# Patient Record
Sex: Female | Born: 1935 | Race: White | Hispanic: No | Marital: Married | Smoking: Never smoker
Health system: Southern US, Community
[De-identification: ages and names within clinical notes are randomized; demographics above are authoritative.]

## PROBLEM LIST (undated history)

## (undated) DIAGNOSIS — E039 Hypothyroidism, unspecified: Secondary | ICD-10-CM

## (undated) DIAGNOSIS — D473 Essential (hemorrhagic) thrombocythemia: Secondary | ICD-10-CM

## (undated) DIAGNOSIS — I10 Essential (primary) hypertension: Secondary | ICD-10-CM

## (undated) DIAGNOSIS — C50919 Malignant neoplasm of unspecified site of unspecified female breast: Secondary | ICD-10-CM

## (undated) DIAGNOSIS — E785 Hyperlipidemia, unspecified: Secondary | ICD-10-CM

## (undated) HISTORY — PX: ABDOMINAL HYSTERECTOMY: SHX81

## (undated) HISTORY — DX: Malignant neoplasm of unspecified site of unspecified female breast: C50.919

## (undated) HISTORY — DX: Hypothyroidism, unspecified: E03.9

## (undated) HISTORY — DX: Essential (primary) hypertension: I10

## (undated) HISTORY — PX: MASTECTOMY: SHX3

## (undated) HISTORY — DX: Hyperlipidemia, unspecified: E78.5

---

## 2003-08-09 ENCOUNTER — Inpatient Hospital Stay (HOSPITAL_COMMUNITY): Admission: RE | Admit: 2003-08-09 | Discharge: 2003-08-10 | Payer: Self-pay | Admitting: Neurosurgery

## 2004-04-03 ENCOUNTER — Ambulatory Visit: Payer: Self-pay | Admitting: General Surgery

## 2005-04-25 ENCOUNTER — Ambulatory Visit: Payer: Self-pay | Admitting: General Surgery

## 2005-06-06 ENCOUNTER — Ambulatory Visit: Payer: Self-pay | Admitting: Unknown Physician Specialty

## 2006-05-06 ENCOUNTER — Ambulatory Visit: Payer: Self-pay | Admitting: General Surgery

## 2007-04-30 ENCOUNTER — Ambulatory Visit: Payer: Self-pay | Admitting: Internal Medicine

## 2008-05-03 ENCOUNTER — Ambulatory Visit: Payer: Self-pay | Admitting: General Surgery

## 2009-05-17 ENCOUNTER — Ambulatory Visit: Payer: Self-pay | Admitting: General Surgery

## 2012-01-28 ENCOUNTER — Ambulatory Visit: Payer: Self-pay | Admitting: Oncology

## 2012-02-12 ENCOUNTER — Ambulatory Visit: Payer: Self-pay | Admitting: Oncology

## 2012-02-12 LAB — CBC CANCER CENTER
Basophil #: 0.1 x10 3/mm (ref 0.0–0.1)
Basophil %: 0.7 %
HGB: 14.4 g/dL (ref 12.0–16.0)
Lymphocyte #: 2.9 x10 3/mm (ref 1.0–3.6)
MCH: 29.5 pg (ref 26.0–34.0)
MCHC: 33.4 g/dL (ref 32.0–36.0)
Monocyte #: 0.7 x10 3/mm (ref 0.2–0.9)
Neutrophil %: 52.7 %

## 2012-02-26 ENCOUNTER — Observation Stay: Payer: Self-pay | Admitting: Internal Medicine

## 2012-02-26 LAB — COMPREHENSIVE METABOLIC PANEL
BUN: 22 mg/dL — ABNORMAL HIGH (ref 7–18)
Bilirubin,Total: 0.4 mg/dL (ref 0.2–1.0)
Calcium, Total: 8.7 mg/dL (ref 8.5–10.1)
Co2: 25 mmol/L (ref 21–32)
EGFR (African American): >60
EGFR (Non-African Amer.): 59 — ABNORMAL LOW
Osmolality: 287 (ref 275–301)
SGPT (ALT): 15 U/L (ref 12–78)
Total Protein: 6.9 g/dL (ref 6.4–8.2)

## 2012-02-26 LAB — CBC
RBC: 4.83 10*6/uL (ref 3.80–5.20)
RDW: 14.3 % (ref 11.5–14.5)
WBC: 10.1 10*3/uL (ref 3.6–11.0)

## 2012-02-26 LAB — LIPASE, BLOOD: Lipase: 248 U/L (ref 73–393)

## 2012-02-26 LAB — CK TOTAL AND CKMB (NOT AT ARMC)
CK-MB: <0.5 ng/mL — ABNORMAL LOW (ref 0.5–3.6)
CK-MB: 0.7 ng/mL (ref 0.5–3.6)

## 2012-02-27 LAB — TROPONIN I: Troponin-I: <0.02 ng/mL

## 2012-02-27 LAB — CK TOTAL AND CKMB (NOT AT ARMC)
CK, Total: 38 U/L (ref 21–215)
CK-MB: <0.5 ng/mL — ABNORMAL LOW (ref 0.5–3.6)

## 2012-03-01 ENCOUNTER — Ambulatory Visit: Payer: Self-pay | Admitting: Oncology

## 2012-03-18 LAB — CBC CANCER CENTER
Eosinophil %: 3.2 %
HGB: 13.8 g/dL (ref 12.0–16.0)
MCH: 29.3 pg (ref 26.0–34.0)
Neutrophil %: 55.6 %
Platelet: 826 x10 3/mm — ABNORMAL HIGH (ref 150–440)
WBC: 11.5 x10 3/mm — ABNORMAL HIGH (ref 3.6–11.0)

## 2012-03-29 ENCOUNTER — Ambulatory Visit: Payer: Self-pay | Admitting: Oncology

## 2012-04-25 LAB — CBC CANCER CENTER
Basophil %: 0.4 %
HCT: 41.9 % (ref 35.0–47.0)
HGB: 14.1 g/dL (ref 12.0–16.0)
Lymphocyte #: 3.8 x10 3/mm — ABNORMAL HIGH (ref 1.0–3.6)
Lymphocyte %: 49.2 %
MCH: 30.8 pg (ref 26.0–34.0)
MCHC: 33.6 g/dL (ref 32.0–36.0)
Monocyte #: 0.5 x10 3/mm (ref 0.2–0.9)
RBC: 4.56 10*6/uL (ref 3.80–5.20)
RDW: 17.4 % — ABNORMAL HIGH (ref 11.5–14.5)
WBC: 7.8 x10 3/mm (ref 3.6–11.0)

## 2012-04-29 ENCOUNTER — Ambulatory Visit: Payer: Self-pay | Admitting: Oncology

## 2012-05-26 LAB — CBC CANCER CENTER
Basophil #: 0 x10 3/mm (ref 0.0–0.1)
Basophil %: 0.2 %
HGB: 13.4 g/dL (ref 12.0–16.0)
Lymphocyte #: 4 x10 3/mm — ABNORMAL HIGH (ref 1.0–3.6)
MCV: 95 fL (ref 80–100)
Monocyte #: 0.5 x10 3/mm (ref 0.2–0.9)
Monocyte %: 7.1 %
Platelet: 273 x10 3/mm (ref 150–440)
WBC: 6.9 x10 3/mm (ref 3.6–11.0)

## 2012-05-29 ENCOUNTER — Ambulatory Visit: Payer: Self-pay | Admitting: Oncology

## 2012-06-26 LAB — CBC CANCER CENTER
Basophil %: 0.4 %
HGB: 12.4 g/dL (ref 12.0–16.0)
Lymphocyte %: 53.7 %
MCV: 101 fL — ABNORMAL HIGH (ref 80–100)
Monocyte #: 0.5 x10 3/mm (ref 0.2–0.9)
Neutrophil #: 2.4 x10 3/mm (ref 1.4–6.5)
WBC: 6.4 x10 3/mm (ref 3.6–11.0)

## 2012-06-29 ENCOUNTER — Ambulatory Visit: Payer: Self-pay | Admitting: Oncology

## 2012-07-24 LAB — CBC CANCER CENTER
Basophil #: 0 x10 3/mm (ref 0.0–0.1)
Basophil %: 0.3 %
Eosinophil %: 1.4 %
HCT: 35.9 % (ref 35.0–47.0)
HGB: 12.6 g/dL (ref 12.0–16.0)
Lymphocyte #: 2.9 x10 3/mm (ref 1.0–3.6)
Lymphocyte %: 53.5 %
MCV: 106 fL — ABNORMAL HIGH (ref 80–100)
Monocyte #: 0.3 x10 3/mm (ref 0.2–0.9)
Neutrophil #: 2.1 x10 3/mm (ref 1.4–6.5)
Neutrophil %: 38.6 %
Platelet: 283 x10 3/mm (ref 150–440)
RBC: 3.4 10*6/uL — ABNORMAL LOW (ref 3.80–5.20)
WBC: 5.5 x10 3/mm (ref 3.6–11.0)

## 2012-07-29 ENCOUNTER — Ambulatory Visit: Payer: Self-pay | Admitting: Oncology

## 2012-09-23 ENCOUNTER — Ambulatory Visit: Payer: Self-pay | Admitting: Oncology

## 2012-09-23 LAB — CBC CANCER CENTER
Basophil #: 0 x10 3/mm (ref 0.0–0.1)
Basophil %: 0.4 %
Eosinophil #: 0.2 x10 3/mm (ref 0.0–0.7)
Eosinophil %: 2.8 %
HCT: 37.5 % (ref 35.0–47.0)
Lymphocyte #: 3.2 x10 3/mm (ref 1.0–3.6)
MCHC: 35.5 g/dL (ref 32.0–36.0)
MCV: 100 fL (ref 80–100)
Neutrophil %: 42.8 %
RBC: 3.75 10*6/uL — ABNORMAL LOW (ref 3.80–5.20)
RDW: 13.1 % (ref 11.5–14.5)

## 2012-09-29 ENCOUNTER — Ambulatory Visit: Payer: Self-pay | Admitting: Oncology

## 2012-12-30 ENCOUNTER — Ambulatory Visit: Payer: Self-pay | Admitting: Oncology

## 2012-12-30 LAB — CBC CANCER CENTER
Basophil #: 0 x10 3/mm (ref 0.0–0.1)
HCT: 43.1 % (ref 35.0–47.0)
HGB: 14.6 g/dL (ref 12.0–16.0)
Lymphocyte #: 3.6 x10 3/mm (ref 1.0–3.6)
Lymphocyte %: 39 %
MCV: 90 fL (ref 80–100)
Platelet: 731 x10 3/mm — ABNORMAL HIGH (ref 150–440)

## 2013-01-29 ENCOUNTER — Ambulatory Visit: Payer: Self-pay | Admitting: Oncology

## 2013-04-02 ENCOUNTER — Ambulatory Visit: Payer: Self-pay | Admitting: Oncology

## 2013-04-02 LAB — CBC CANCER CENTER
BASOS ABS: 0 x10 3/mm (ref 0.0–0.1)
BASOS PCT: 0.3 %
Eosinophil #: 0.1 x10 3/mm (ref 0.0–0.7)
Eosinophil %: 1.5 %
HCT: 38.6 % (ref 35.0–47.0)
HGB: 12.8 g/dL (ref 12.0–16.0)
LYMPHS PCT: 50.5 %
Lymphocyte #: 3.3 x10 3/mm (ref 1.0–3.6)
MCH: 32.8 pg (ref 26.0–34.0)
MCHC: 33.2 g/dL (ref 32.0–36.0)
MCV: 99 fL (ref 80–100)
MONOS PCT: 7.3 %
Monocyte #: 0.5 x10 3/mm (ref 0.2–0.9)
Neutrophil #: 2.6 x10 3/mm (ref 1.4–6.5)
Neutrophil %: 40.4 %
PLATELETS: 306 x10 3/mm (ref 150–440)
RBC: 3.91 10*6/uL (ref 3.80–5.20)
RDW: 22.4 % — ABNORMAL HIGH (ref 11.5–14.5)
WBC: 6.5 x10 3/mm (ref 3.6–11.0)

## 2013-04-29 ENCOUNTER — Ambulatory Visit: Payer: Self-pay | Admitting: Oncology

## 2013-07-03 ENCOUNTER — Ambulatory Visit: Payer: Self-pay | Admitting: Oncology

## 2013-07-03 LAB — CBC CANCER CENTER
BASOS ABS: 0 x10 3/mm (ref 0.0–0.1)
Basophil %: 0.3 %
EOS ABS: 0.1 x10 3/mm (ref 0.0–0.7)
Eosinophil %: 1.2 %
HCT: 38 % (ref 35.0–47.0)
HGB: 12.9 g/dL (ref 12.0–16.0)
Lymphocyte #: 3.7 x10 3/mm — ABNORMAL HIGH (ref 1.0–3.6)
Lymphocyte %: 54.6 %
MCH: 38.1 pg — AB (ref 26.0–34.0)
MCHC: 34 g/dL (ref 32.0–36.0)
MCV: 112 fL — ABNORMAL HIGH (ref 80–100)
MONO ABS: 0.5 x10 3/mm (ref 0.2–0.9)
MONOS PCT: 7 %
Neutrophil #: 2.5 x10 3/mm (ref 1.4–6.5)
Neutrophil %: 36.9 %
Platelet: 296 x10 3/mm (ref 150–440)
RBC: 3.39 10*6/uL — AB (ref 3.80–5.20)
RDW: 14.1 % (ref 11.5–14.5)
WBC: 6.8 x10 3/mm (ref 3.6–11.0)

## 2013-07-29 ENCOUNTER — Ambulatory Visit: Payer: Self-pay | Admitting: Oncology

## 2013-10-02 ENCOUNTER — Ambulatory Visit: Payer: Self-pay | Admitting: Oncology

## 2013-10-02 LAB — CBC CANCER CENTER
BASOS PCT: 0.3 %
Basophil #: 0 x10 3/mm (ref 0.0–0.1)
EOS ABS: 0.1 x10 3/mm (ref 0.0–0.7)
Eosinophil %: 1.6 %
HCT: 37 % (ref 35.0–47.0)
HGB: 12.5 g/dL (ref 12.0–16.0)
LYMPHS PCT: 55.8 %
Lymphocyte #: 3.3 x10 3/mm (ref 1.0–3.6)
MCH: 37.8 pg — AB (ref 26.0–34.0)
MCHC: 33.8 g/dL (ref 32.0–36.0)
MCV: 112 fL — AB (ref 80–100)
MONO ABS: 0.5 x10 3/mm (ref 0.2–0.9)
MONOS PCT: 9.1 %
NEUTROS ABS: 2 x10 3/mm (ref 1.4–6.5)
NEUTROS PCT: 33.2 %
Platelet: 328 x10 3/mm (ref 150–440)
RBC: 3.31 10*6/uL — AB (ref 3.80–5.20)
RDW: 14.3 % (ref 11.5–14.5)
WBC: 5.9 x10 3/mm (ref 3.6–11.0)

## 2013-10-29 ENCOUNTER — Ambulatory Visit: Payer: Self-pay | Admitting: Oncology

## 2014-01-01 ENCOUNTER — Ambulatory Visit: Payer: Self-pay | Admitting: Oncology

## 2014-01-01 LAB — CBC CANCER CENTER
Basophil #: 0 x10 3/mm (ref 0.0–0.1)
Basophil %: 0.3 %
EOS ABS: 0.1 x10 3/mm (ref 0.0–0.7)
EOS PCT: 1.4 %
HCT: 37.7 % (ref 35.0–47.0)
HGB: 12.7 g/dL (ref 12.0–16.0)
Lymphocyte #: 3.6 x10 3/mm (ref 1.0–3.6)
Lymphocyte %: 57.2 %
MCH: 36.9 pg — AB (ref 26.0–34.0)
MCHC: 33.7 g/dL (ref 32.0–36.0)
MCV: 110 fL — ABNORMAL HIGH (ref 80–100)
Monocyte #: 0.5 x10 3/mm (ref 0.2–0.9)
Monocyte %: 8.1 %
NEUTROS ABS: 2.1 x10 3/mm (ref 1.4–6.5)
NEUTROS PCT: 33 %
PLATELETS: 301 x10 3/mm (ref 150–440)
RBC: 3.44 10*6/uL — ABNORMAL LOW (ref 3.80–5.20)
RDW: 14 % (ref 11.5–14.5)
WBC: 6.2 x10 3/mm (ref 3.6–11.0)

## 2014-01-29 ENCOUNTER — Ambulatory Visit: Payer: Self-pay | Admitting: Oncology

## 2014-04-05 ENCOUNTER — Ambulatory Visit
Admit: 2014-04-05 | Disposition: A | Discharge status: Home / Self Care | Payer: Self-pay | Attending: Oncology | Admitting: Oncology

## 2014-04-30 ENCOUNTER — Ambulatory Visit
Admit: 2014-04-30 | Disposition: A | Discharge status: Home / Self Care | Payer: Self-pay | Attending: Oncology | Admitting: Oncology

## 2014-05-21 NOTE — H&P (Signed)
PATIENT NAME:  Rachel Durham, Rachel Durham MR#:  [id number] DATE OF BIRTH:  Apr 11, 1920  DATE OF ADMISSION:  02/26/2012  PRIMARY CARE PHYSICIAN: Lisette Grinder III, MD  CHIEF COMPLAINT: Left-sided chest pain.   HISTORY OF PRESENTING ILLNESS: This is an 81 year old female patient with history of hypertension, left breast cancer status post mastectomy and hypothyroidism who presented to the ER complaining of 4 hours of acute onset left-sided chest pain radiating to the left arm. This woke her up from sleep at 3:00 a.m. and lasted until 7:00 a.m. She had similar episode for short duration in the Emergency Room. She had associated nausea and shortness of breath with this and her symptoms have completely resolved at this time. The patient had a cath 10 years prior, which was normal, as per the daughter. Her symptoms started yesterday night with some burping. Normally no chest pain with exertion.   PAST MEDICAL HISTORY: 1. Hypertension. 2. Hypothyroidism. 3. Thrombocytosis. 4. Hyperlipidemia.   PAST SURGICAL HISTORY: 1. Breast cancer status post mastectomy, on the left side. 2. Total hysterectomy.   FAMILY HISTORY: Reviewed and unknown.   SOCIAL HISTORY: The patient does not smoke. No alcohol. No illicit drugs. Ambulates with a walker. No home oxygen.   CODE STATUS: FULL CODE.   HOME MEDICATIONS: 1. Aspirin 81 mg oral once a day.  2. Hydrochlorothiazide/lisinopril 12.5/20 mg once a day.  3. Nadolol 20 mg oral once a day.  4. Levothyroxine 75 mcg oral once a day.  5. Multivitamin 1 tablet oral once a day.  6. Acidophilus 2 tablets oral once a day.  7. Calcium with vitamin D 1 tablet once a day.  8. Garlic 1 capsule oral once a day.  9. Fish oil 1200 mg oral once a day. 10. Flaxseed oil 1 tablet oral once a day.  11. Vitamins D3 1000 international units oral once a day.  12. Glucosamine chondroitin 1 tablet oral 3 times a day.   REVIEW OF SYSTEMS:   CONSTITUTIONAL: No weight loss, weight gain or  fever.  EYES: No discharge, pain or redness.  ENT: No tinnitus, hearing loss or ringing.  GASTROINTESTINAL: No dysphagia, abdominal pain.  CARDIOVASCULAR: Complains of chest pain radiating to the left arm. No edema, PND or orthopnea.  RESPIRATORY: Has shortness of breath with chest pain, but no wheezing or cough.  GENITOURINARY: No dysuria or hematuria.   MUSCULOSKELETAL: Has some arthritis. No myalgias.  HEMATOLOGIC: Has thrombocytosis. No anemia, easy bruising or bleeding.  PSYCHIATRIC: No anxiety or depression.   ALLERGIES: EVISTA AND FOSAMAX.  PHYSICAL EXAMINATION:  VITALS: Temperature 98.8, pulse 74, blood pressure 149/77 and saturating 98% on room air.  GENERAL: Elderly Caucasian female patient lying in bed, comfortable, conversational, cooperative with exam.  PSYCHIATRIC: Alert and oriented x 3. Mood and affect appropriate. Judgment intact.  HEENT: Atraumatic, normocephalic. Oral mucosa moist and pink. External ears and nose normal. No pallor. No icterus. Pupils bilaterally are equally round and reactive to light.  NECK: Supple. No thyromegaly. No palpable lymph nodes. Trachea midline. No carotid bruit or JVD.  CARDIOVASCULAR: S1, S2. No chest wall tenderness. No edema. No murmurs. Peripheral pulses 2+.  RESPIRATORY: Normal work of breathing. Clear to auscultation on both sides. Normal to percussion.  ABDOMEN: Soft abdomen, nontender. Bowel sounds present. No hepatosplenomegaly palpable.  SKIN: Warm and dry. No petechiae, rash or ulcers.  MUSCULOSKELETAL: No joint swelling, redness or effusion of the large joints. Normal muscle tone.  NEUROLOGICAL: Motor strength 5 out of 5 in upper  and lower extremities. Sensation to fine touch intact all over. Cranial nerves II through XII intact.  LABS/RADIOLOGIC STUDIES: Troponin is less than 0.02. Albumin is 3.7 and creatinine 0.87. WBC 10.7 and platelets 723.   EKG shows Q waves and T wave inversions in V2 and V3. No prior EKGs to compare  with.   ASSESSMENT AND PLAN: 1. Chest pain in a patient with risk factors of hypertension and hyperlipidemia. The patient did have chest pain on the left side radiating to the left arm lasting four hours and now resolved. We will check 2 more sets of cardiac enzymes. Had normal cath, as per the daughter, 10 years prior. We will not put her through any stress test or cath at this time. She might need further testing if the enzymes are abnormal. The patient does seem to be very functional with her activities of daily living.  2. Hypertension, well controlled. Continue home medications.  3. Thrombocytosis, stable. Follows with Dr. Grayland Ormond.  4. Deep vein thrombosis prophylaxis with heparin.   CODE STATUS: FULL CODE.   TIME SPENT: 45 minutes. ____________________________ Leia Alf Aneka Fagerstrom, MD srs:sb D: 02/26/2012 13:27:42 ET T: 02/26/2012 13:53:02 ET JOB#: [id number]  cc: Alveta Heimlich R. Evalynn Hankins, MD, <Dictator> John B. Sarina Ser, MD Alveta Heimlich Arlice Colt MD ELECTRONICALLY SIGNED 03/06/2012 13:20

## 2014-07-07 ENCOUNTER — Encounter: Payer: Self-pay | Admitting: Oncology

## 2014-07-07 ENCOUNTER — Inpatient Hospital Stay: Payer: Medicare Other | Attending: Oncology

## 2014-07-07 ENCOUNTER — Other Ambulatory Visit: Payer: Self-pay | Admitting: *Deleted

## 2014-07-07 ENCOUNTER — Inpatient Hospital Stay (HOSPITAL_BASED_OUTPATIENT_CLINIC_OR_DEPARTMENT_OTHER): Payer: Medicare Other | Admitting: Oncology

## 2014-07-07 VITALS — BP 155/74 | HR 59 | Temp 95.8°F | Resp 16 | Wt 215.8 lb

## 2014-07-07 DIAGNOSIS — Z90710 Acquired absence of both cervix and uterus: Secondary | ICD-10-CM | POA: Diagnosis not present

## 2014-07-07 DIAGNOSIS — Z7982 Long term (current) use of aspirin: Secondary | ICD-10-CM | POA: Insufficient documentation

## 2014-07-07 DIAGNOSIS — Z79899 Other long term (current) drug therapy: Secondary | ICD-10-CM

## 2014-07-07 DIAGNOSIS — E039 Hypothyroidism, unspecified: Secondary | ICD-10-CM | POA: Diagnosis not present

## 2014-07-07 DIAGNOSIS — Z853 Personal history of malignant neoplasm of breast: Secondary | ICD-10-CM

## 2014-07-07 DIAGNOSIS — I10 Essential (primary) hypertension: Secondary | ICD-10-CM | POA: Insufficient documentation

## 2014-07-07 DIAGNOSIS — E785 Hyperlipidemia, unspecified: Secondary | ICD-10-CM | POA: Diagnosis not present

## 2014-07-07 DIAGNOSIS — Z9010 Acquired absence of unspecified breast and nipple: Secondary | ICD-10-CM

## 2014-07-07 DIAGNOSIS — D473 Essential (hemorrhagic) thrombocythemia: Secondary | ICD-10-CM | POA: Diagnosis not present

## 2014-07-07 DIAGNOSIS — D75839 Thrombocytosis, unspecified: Secondary | ICD-10-CM

## 2014-07-07 LAB — CBC WITH DIFFERENTIAL/PLATELET
BASOS PCT: 0 %
Basophils Absolute: 0 10*3/uL (ref 0–0.1)
EOS ABS: 0.1 10*3/uL (ref 0–0.7)
Eosinophils Relative: 2 %
HEMATOCRIT: 37.7 % (ref 35.0–47.0)
Hemoglobin: 12.7 g/dL (ref 12.0–16.0)
LYMPHS ABS: 3.5 10*3/uL (ref 1.0–3.6)
Lymphocytes Relative: 57 %
MCH: 36.6 pg — ABNORMAL HIGH (ref 26.0–34.0)
MCHC: 33.7 g/dL (ref 32.0–36.0)
MCV: 108.6 fL — AB (ref 80.0–100.0)
MONO ABS: 0.6 10*3/uL (ref 0.2–0.9)
MONOS PCT: 9 %
NEUTROS ABS: 1.9 10*3/uL (ref 1.4–6.5)
NEUTROS PCT: 32 %
PLATELETS: 334 10*3/uL (ref 150–440)
RBC: 3.47 MIL/uL — ABNORMAL LOW (ref 3.80–5.20)
RDW: 14.6 % — AB (ref 11.5–14.5)
WBC: 6.1 10*3/uL (ref 3.6–11.0)

## 2014-08-01 NOTE — Progress Notes (Signed)
[place]  Telephone:(336) [phone] [phone]) [phone]  ID: Rachel Durham OB: 20-Apr-1920  MR#: [id number]  JKD#:[id number]  Patient Care Team: Madelyn Brunner, MD as PCP - General (Internal Medicine)  CHIEF COMPLAINT:  Chief Complaint  Patient presents with  . Follow-up    thrombocytosis    INTERVAL HISTORY: Patient returns to clinic today for repeat laboratory work and further evaluation.  She continues to feel well and is asymptomatic. She is tolerating Hydrea without significant side effects.  She continues to have mild memory problems, but has no other neurologic complaints.  She has no recent fevers or illnesses.  She has a good appetite and denies weight loss.  She has no chest pain or shortness of breath.  She denies any nausea, vomiting, constipation, or diarrhea.  She has no urinary complaints.  Patient offers no specific complaints today.  REVIEW OF SYSTEMS:   Review of Systems  Constitutional: Negative.   Endo/Heme/Allergies: Does not bruise/bleed easily.    As per HPI. Otherwise, a complete review of systems is negatve.  PAST MEDICAL HISTORY: Past Medical History  Diagnosis Date  . Hypertension   . Hypothyroidism   . Breast cancer   . Hyperlipidemia     PAST SURGICAL HISTORY: Past Surgical History  Procedure Laterality Date  . Mastectomy    . Abdominal hysterectomy      FAMILY HISTORY No family history on file.     ADVANCED DIRECTIVES:    HEALTH MAINTENANCE: History  Substance Use Topics  . Smoking status: Never Smoker   . Smokeless tobacco: Never Used  . Alcohol Use: No     Colonoscopy:  PAP:  Bone density:  Lipid panel:  Allergies  Allergen Reactions  . Fosamax [Alendronate Sodium]   . Raloxifene     Other reaction(s): Unknown Other reaction(s): Unknown    Current Outpatient Prescriptions  Medication Sig Dispense Refill  . aspirin 81 MG tablet Take 81 mg by mouth daily.    . calcium carbonate (CALCIUM 600)  600 MG TABS tablet Take by mouth.    . Cholecalciferol (VITAMIN D3) 1000 UNITS CAPS Take 1 capsule by mouth daily.    . Flaxseed, Linseed, (FLAX SEED OIL PO) Take 1 capsule by mouth.    Marland Kitchen GARLIC OIL PO Take 1 capsule by mouth daily.    Marland Kitchen glucosamine-chondroitin 500-400 MG tablet Take 1 tablet by mouth 3 (three) times daily.    . hydroxyurea (HYDREA) 500 MG capsule     . Lactobacillus (ACIDOPHILUS) 100 MG CAPS Take 2 capsules by mouth daily.    Marland Kitchen levothyroxine (SYNTHROID, LEVOTHROID) 75 MCG tablet     . lisinopril-hydrochlorothiazide (PRINZIDE,ZESTORETIC) 20-12.5 MG per tablet     . Multiple Vitamin (MULTIVITAMIN) capsule Take 1 capsule by mouth daily.    . Omega-3 Fatty Acids (FISH OIL) 1200 MG CAPS Take 1 capsule by mouth daily.    . propranolol (INDERAL) 10 MG tablet      No current facility-administered medications for this visit.    OBJECTIVE: Filed Vitals:   07/07/14 1138  BP: 155/74  Pulse: 59  Temp: 95.8 F (35.4 C)  Resp: 16     There is no height on file to calculate BMI.    ECOG FS:1 - Symptomatic but completely ambulatory  General: Well-developed, well-nourished, no acute distress. Eyes: anicteric sclera. Lungs: Clear to auscultation bilaterally. Heart: Regular rate and rhythm. No rubs, murmurs, or gallops. Abdomen: Soft, nontender, nondistended. No organomegaly noted, normoactive bowel sounds. Musculoskeletal:  No edema, cyanosis, or clubbing. Neuro: Alert, answering all questions appropriately. Cranial nerves grossly intact. Skin: No rashes or petechiae noted. Psych: Normal affect.   LAB RESULTS:  Lab Results  Component Value Date   NA 142 02/26/2012   K 3.6 02/26/2012   CL 108* 02/26/2012   CO2 25 02/26/2012   GLUCOSE 101* 02/26/2012   BUN 22* 02/26/2012   CREATININE 0.87 02/26/2012   CALCIUM 8.7 02/26/2012   PROT 6.9 02/26/2012   ALBUMIN 3.7 02/26/2012   AST 23 02/26/2012   ALT 15 02/26/2012   ALKPHOS 122 02/26/2012   GFRNONAA 59* 02/26/2012    GFRAA >60 02/26/2012    Lab Results  Component Value Date   WBC 6.1 07/07/2014   NEUTROABS 1.9 07/07/2014   HGB 12.7 07/07/2014   HCT 37.7 07/07/2014   MCV 108.6* 07/07/2014   PLT 334 07/07/2014     STUDIES: No results found.  ASSESSMENT: Clinical essential thrombocytosis.  PLAN:    1.  ET: Patient's platelet count continues to be within normal limits. Continue 500 mg Hydrea daily. Despite the fact that her JAK-2 mutation is negative, she clinically has essential thrombocytosis. To confirm would require bone marrow biopsy, which is not necessary at this point. Previously patient was taken off Hydrea temporarily at which time her platelet count increased significantly.  Return to clinic in 3 months with repeat laboratory work and then in 6 months with repeat laboratory work and further evaluation.   2. Elevated MCV: Secondary to Hydrea.   Patient expressed understanding and was in agreement with this plan. She also understands that She can call clinic at any time with any questions, concerns, or complaints.    [name] Huger, MD   08/01/2014 10:30 AM

## 2014-08-09 ENCOUNTER — Telehealth: Payer: Self-pay | Admitting: *Deleted

## 2014-08-09 MED ORDER — HYDROXYUREA 500 MG PO CAPS: 500.0000 mg | ORAL_CAPSULE | Freq: Every day | ORAL | Status: DC

## 2014-08-09 NOTE — Telephone Encounter (Signed)
faxed

## 2014-10-07 ENCOUNTER — Inpatient Hospital Stay: Payer: Medicare Other | Attending: Oncology

## 2014-10-07 DIAGNOSIS — D473 Essential (hemorrhagic) thrombocythemia: Secondary | ICD-10-CM | POA: Diagnosis present

## 2014-10-07 LAB — CBC WITH DIFFERENTIAL/PLATELET
BASOS ABS: 0 10*3/uL (ref 0–0.1)
Basophils Relative: 0 %
EOS ABS: 0.1 10*3/uL (ref 0–0.7)
EOS PCT: 2 %
HCT: 36.7 % (ref 35.0–47.0)
HEMOGLOBIN: 12.6 g/dL (ref 12.0–16.0)
Lymphocytes Relative: 53 %
Lymphs Abs: 2.9 10*3/uL (ref 1.0–3.6)
MCH: 37.9 pg — ABNORMAL HIGH (ref 26.0–34.0)
MCHC: 34.3 g/dL (ref 32.0–36.0)
MCV: 110.5 fL — ABNORMAL HIGH (ref 80.0–100.0)
Monocytes Absolute: 0.5 10*3/uL (ref 0.2–0.9)
Monocytes Relative: 9 %
NEUTROS PCT: 36 %
Neutro Abs: 2 10*3/uL (ref 1.4–6.5)
PLATELETS: 341 10*3/uL (ref 150–440)
RBC: 3.32 MIL/uL — AB (ref 3.80–5.20)
RDW: 14.9 % — ABNORMAL HIGH (ref 11.5–14.5)
WBC: 5.4 10*3/uL (ref 3.6–11.0)

## 2014-12-08 ENCOUNTER — Telehealth: Payer: Self-pay | Admitting: *Deleted

## 2014-12-08 MED ORDER — HYDROXYUREA 500 MG PO CAPS: 500.0000 mg | ORAL_CAPSULE | Freq: Every day | ORAL | Status: DC

## 2014-12-08 NOTE — Telephone Encounter (Signed)
Escribed

## 2015-01-06 ENCOUNTER — Telehealth: Payer: Self-pay | Admitting: *Deleted

## 2015-01-06 ENCOUNTER — Inpatient Hospital Stay (HOSPITAL_BASED_OUTPATIENT_CLINIC_OR_DEPARTMENT_OTHER): Payer: Medicare Other | Admitting: Oncology

## 2015-01-06 ENCOUNTER — Inpatient Hospital Stay: Payer: Medicare Other | Attending: Oncology

## 2015-01-06 VITALS — BP 142/83 | HR 61 | Temp 97.4°F | Resp 16 | Wt 118.6 lb

## 2015-01-06 DIAGNOSIS — Z79899 Other long term (current) drug therapy: Secondary | ICD-10-CM | POA: Insufficient documentation

## 2015-01-06 DIAGNOSIS — D473 Essential (hemorrhagic) thrombocythemia: Secondary | ICD-10-CM | POA: Insufficient documentation

## 2015-01-06 DIAGNOSIS — I10 Essential (primary) hypertension: Secondary | ICD-10-CM

## 2015-01-06 DIAGNOSIS — Z7982 Long term (current) use of aspirin: Secondary | ICD-10-CM | POA: Insufficient documentation

## 2015-01-06 DIAGNOSIS — Z90710 Acquired absence of both cervix and uterus: Secondary | ICD-10-CM | POA: Insufficient documentation

## 2015-01-06 DIAGNOSIS — Z853 Personal history of malignant neoplasm of breast: Secondary | ICD-10-CM

## 2015-01-06 DIAGNOSIS — E039 Hypothyroidism, unspecified: Secondary | ICD-10-CM

## 2015-01-06 DIAGNOSIS — Z9010 Acquired absence of unspecified breast and nipple: Secondary | ICD-10-CM | POA: Insufficient documentation

## 2015-01-06 DIAGNOSIS — E785 Hyperlipidemia, unspecified: Secondary | ICD-10-CM | POA: Diagnosis not present

## 2015-01-06 DIAGNOSIS — R7989 Other specified abnormal findings of blood chemistry: Secondary | ICD-10-CM

## 2015-01-06 DIAGNOSIS — D75839 Thrombocytosis, unspecified: Secondary | ICD-10-CM

## 2015-01-06 LAB — CBC WITH DIFFERENTIAL/PLATELET
Basophils Absolute: 0 10*3/uL (ref 0–0.1)
Basophils Relative: 0 %
EOS ABS: 0.1 10*3/uL (ref 0–0.7)
EOS PCT: 2 %
HCT: 36.8 % (ref 35.0–47.0)
Hemoglobin: 12.6 g/dL (ref 12.0–16.0)
LYMPHS ABS: 2.5 10*3/uL (ref 1.0–3.6)
Lymphocytes Relative: 52 %
MCH: 37.4 pg — AB (ref 26.0–34.0)
MCHC: 34.2 g/dL (ref 32.0–36.0)
MCV: 109.1 fL — ABNORMAL HIGH (ref 80.0–100.0)
MONOS PCT: 11 %
Monocytes Absolute: 0.5 10*3/uL (ref 0.2–0.9)
Neutro Abs: 1.7 10*3/uL (ref 1.4–6.5)
Neutrophils Relative %: 35 %
PLATELETS: 323 10*3/uL (ref 150–440)
RBC: 3.37 MIL/uL — ABNORMAL LOW (ref 3.80–5.20)
RDW: 13.8 % (ref 11.5–14.5)
WBC: 4.9 10*3/uL (ref 3.6–11.0)

## 2015-01-06 MED ORDER — HYDROXYUREA 500 MG PO CAPS
500.0000 mg | ORAL_CAPSULE | Freq: Every day | ORAL | Status: DC
Start: 2015-01-06 — End: 2015-04-29

## 2015-01-06 NOTE — Telephone Encounter (Signed)
Patient has an appt this morning, Lorie states she will call her sister and make sure she comes in for it

## 2015-01-06 NOTE — Progress Notes (Signed)
Patient denies any problems today. 

## 2015-01-28 NOTE — Progress Notes (Signed)
[facility]  Telephone:(336) [phone] Fax:(336) [phone]  ID: Rachel Durham OB: 04-13-1920  MR#: [id number]  QMG#:[id number]  Patient Care Team: Madelyn Brunner, MD as PCP - General (Internal Medicine)  CHIEF COMPLAINT:  Chief Complaint  Patient presents with  . thrombocytosis    INTERVAL HISTORY: Patient returns to clinic today for repeat laboratory work and further evaluation.  She continues to feel well and is asymptomatic. She is tolerating Hydrea without significant side effects.  She has no neurologic complaints.  She has denies any recent fevers or illnesses.  She has a good appetite and denies weight loss.  She has no chest pain or shortness of breath.  She denies any nausea, vomiting, constipation, or diarrhea.  She has no urinary complaints.  Patient offers no specific complaints today.  REVIEW OF SYSTEMS:   Review of Systems  Constitutional: Negative.   Respiratory: Negative.   Cardiovascular: Negative.   Musculoskeletal: Negative.   Neurological: Negative.   Endo/Heme/Allergies: Does not bruise/bleed easily.  Psychiatric/Behavioral: Positive for memory loss.    As per HPI. Otherwise, a complete review of systems is negatve.  PAST MEDICAL HISTORY: Past Medical History  Diagnosis Date  . Hypertension   . Hypothyroidism   . Breast cancer   . Hyperlipidemia     PAST SURGICAL HISTORY: Past Surgical History  Procedure Laterality Date  . Mastectomy    . Abdominal hysterectomy      FAMILY HISTORY No family history on file.     ADVANCED DIRECTIVES:    HEALTH MAINTENANCE: Social History  Substance Use Topics  . Smoking status: Never Smoker   . Smokeless tobacco: Never Used  . Alcohol Use: No     Colonoscopy:  PAP:  Bone density:  Lipid panel:  Allergies  Allergen Reactions  . Fosamax [Alendronate Sodium]   . Raloxifene     Other reaction(s): Unknown Other reaction(s): Unknown    Current Outpatient Prescriptions    Medication Sig Dispense Refill  . aspirin 81 MG tablet Take 81 mg by mouth daily.    . calcium carbonate (CALCIUM 600) 600 MG TABS tablet Take by mouth.    . Cholecalciferol (VITAMIN D3) 1000 UNITS CAPS Take 1 capsule by mouth daily.    . Flaxseed, Linseed, (FLAX SEED OIL PO) Take 1 capsule by mouth.    Marland Kitchen GARLIC OIL PO Take 1 capsule by mouth daily.    Marland Kitchen glucosamine-chondroitin 500-400 MG tablet Take 1 tablet by mouth 3 (three) times daily.    . hydroxyurea (HYDREA) 500 MG capsule Take 1 capsule (500 mg total) by mouth daily. 30 capsule 3  . Lactobacillus (ACIDOPHILUS) 100 MG CAPS Take 2 capsules by mouth daily.    Marland Kitchen levothyroxine (SYNTHROID, LEVOTHROID) 75 MCG tablet     . lisinopril-hydrochlorothiazide (PRINZIDE,ZESTORETIC) 20-12.5 MG per tablet     . Multiple Vitamin (MULTIVITAMIN) capsule Take 1 capsule by mouth daily.    . Omega-3 Fatty Acids (FISH OIL) 1200 MG CAPS Take 1 capsule by mouth daily.    . propranolol (INDERAL) 10 MG tablet      No current facility-administered medications for this visit.    OBJECTIVE: Filed Vitals:   01/06/15 1040  BP: 142/83  Pulse: 61  Temp: 97.4 F (36.3 C)  Resp: 16     There is no height on file to calculate BMI.    ECOG FS:1 - Symptomatic but completely ambulatory  General: Well-developed, well-nourished, no acute distress. Eyes: anicteric sclera. Lungs: Clear to auscultation  bilaterally. Heart: Regular rate and rhythm. No rubs, murmurs, or gallops. Abdomen: Soft, nontender, nondistended. No organomegaly noted, normoactive bowel sounds. Musculoskeletal: No edema, cyanosis, or clubbing. Neuro: Alert, answering all questions appropriately. Cranial nerves grossly intact. Skin: No rashes or petechiae noted. Psych: Normal affect.   LAB RESULTS:  Lab Results  Component Value Date   NA 142 02/26/2012   K 3.6 02/26/2012   CL 108* 02/26/2012   CO2 25 02/26/2012   GLUCOSE 101* 02/26/2012   BUN 22* 02/26/2012   CREATININE 0.87  02/26/2012   CALCIUM 8.7 02/26/2012   PROT 6.9 02/26/2012   ALBUMIN 3.7 02/26/2012   AST 23 02/26/2012   ALT 15 02/26/2012   ALKPHOS 122 02/26/2012   BILITOT 0.4 02/26/2012   GFRNONAA 59* 02/26/2012   GFRAA >60 02/26/2012    Lab Results  Component Value Date   WBC 4.9 01/06/2015   NEUTROABS 1.7 01/06/2015   HGB 12.6 01/06/2015   HCT 36.8 01/06/2015   MCV 109.1* 01/06/2015   PLT 323 01/06/2015     STUDIES: No results found.  ASSESSMENT: Clinical essential thrombocytosis.  PLAN:    1.  ET: Patient's platelet count continues to be within normal limits. Continue 500 mg Hydrea and 81 mg aspirin daily. Despite the fact that her JAK-2 mutation is negative, she clinically has essential thrombocytosis. To confirm would require bone marrow biopsy, which is not necessary at this point. Previously patient was taken off Hydrea temporarily at which time her platelet count increased significantly.  Return to clinic in 6 months with repeat laboratory work and further evaluation.   2. Elevated MCV: Secondary to Hydrea.  3. Hypertension: Blood pressure mildly elevated today, continue current medications as prescribed.   Patient expressed understanding and was in agreement with this plan. She also understands that She can call clinic at any time with any questions, concerns, or complaints.    [name] Huger, MD   01/28/2015 9:02 AM

## 2015-04-29 ENCOUNTER — Other Ambulatory Visit: Payer: Self-pay | Admitting: *Deleted

## 2015-04-29 MED ORDER — HYDROXYUREA 500 MG PO CAPS: 500.0000 mg | ORAL_CAPSULE | Freq: Every day | ORAL | Status: DC

## 2015-07-07 ENCOUNTER — Inpatient Hospital Stay: Payer: Medicare Other | Attending: Oncology

## 2015-07-07 ENCOUNTER — Inpatient Hospital Stay (HOSPITAL_BASED_OUTPATIENT_CLINIC_OR_DEPARTMENT_OTHER): Payer: Medicare Other | Admitting: Oncology

## 2015-07-07 VITALS — BP 165/77 | HR 68 | Resp 16

## 2015-07-07 DIAGNOSIS — D75839 Thrombocytosis, unspecified: Secondary | ICD-10-CM

## 2015-07-07 DIAGNOSIS — I10 Essential (primary) hypertension: Secondary | ICD-10-CM

## 2015-07-07 DIAGNOSIS — Z9010 Acquired absence of unspecified breast and nipple: Secondary | ICD-10-CM | POA: Insufficient documentation

## 2015-07-07 DIAGNOSIS — Z853 Personal history of malignant neoplasm of breast: Secondary | ICD-10-CM | POA: Insufficient documentation

## 2015-07-07 DIAGNOSIS — E039 Hypothyroidism, unspecified: Secondary | ICD-10-CM | POA: Insufficient documentation

## 2015-07-07 DIAGNOSIS — Z7982 Long term (current) use of aspirin: Secondary | ICD-10-CM | POA: Diagnosis not present

## 2015-07-07 DIAGNOSIS — E785 Hyperlipidemia, unspecified: Secondary | ICD-10-CM

## 2015-07-07 DIAGNOSIS — Z79899 Other long term (current) drug therapy: Secondary | ICD-10-CM | POA: Insufficient documentation

## 2015-07-07 DIAGNOSIS — D473 Essential (hemorrhagic) thrombocythemia: Secondary | ICD-10-CM | POA: Insufficient documentation

## 2015-07-07 DIAGNOSIS — Z90710 Acquired absence of both cervix and uterus: Secondary | ICD-10-CM | POA: Diagnosis not present

## 2015-07-07 LAB — CBC WITH DIFFERENTIAL/PLATELET
BASOS ABS: 0 10*3/uL (ref 0–0.1)
BASOS PCT: 0 %
EOS ABS: 0.1 10*3/uL (ref 0–0.7)
Eosinophils Relative: 2 %
HEMATOCRIT: 36.8 % (ref 35.0–47.0)
Hemoglobin: 12.7 g/dL (ref 12.0–16.0)
Lymphocytes Relative: 53 %
Lymphs Abs: 2.7 10*3/uL (ref 1.0–3.6)
MCH: 37.4 pg — ABNORMAL HIGH (ref 26.0–34.0)
MCHC: 34.4 g/dL (ref 32.0–36.0)
MCV: 108.7 fL — ABNORMAL HIGH (ref 80.0–100.0)
MONO ABS: 0.5 10*3/uL (ref 0.2–0.9)
Monocytes Relative: 9 %
NEUTROS ABS: 1.9 10*3/uL (ref 1.4–6.5)
NEUTROS PCT: 36 %
Platelets: 322 10*3/uL (ref 150–440)
RBC: 3.39 MIL/uL — ABNORMAL LOW (ref 3.80–5.20)
RDW: 14.1 % (ref 11.5–14.5)
WBC: 5.2 10*3/uL (ref 3.6–11.0)

## 2015-07-07 MED ORDER — HYDROXYUREA 500 MG PO CAPS: 500.0000 mg | ORAL_CAPSULE | Freq: Every day | ORAL | Status: DC

## 2015-07-07 NOTE — Progress Notes (Signed)
[facility]  Telephone:(336) [phone] Fax:(336) [phone]  ID: Rachel Durham OB: 1920-04-01  MR#: [id number]  OKH#:[id number]  Patient Care Team: Madelyn Brunner, MD as PCP - General (Internal Medicine)  CHIEF COMPLAINT:  Chief Complaint  Patient presents with  . thrombocytosis    INTERVAL HISTORY: Patient returns to clinic today for repeat laboratory work and further evaluation.  She continues to feel well and is asymptomatic. She is tolerating Hydrea without significant side effects.  She has no neurologic complaints.  She has denies any recent fevers or illnesses.  She has a good appetite and denies weight loss.  She has no chest pain or shortness of breath.  She denies any nausea, vomiting, constipation, or diarrhea.  She has no urinary complaints.  Patient offers no specific complaints today.  REVIEW OF SYSTEMS:   Review of Systems  Constitutional: Negative.   Respiratory: Negative.   Cardiovascular: Negative.   Musculoskeletal: Negative.   Neurological: Negative.   Endo/Heme/Allergies: Does not bruise/bleed easily.  Psychiatric/Behavioral: Positive for memory loss.    As per HPI. Otherwise, a complete review of systems is negatve.  PAST MEDICAL HISTORY: Past Medical History  Diagnosis Date  . Hypertension   . Hypothyroidism   . Breast cancer   . Hyperlipidemia     PAST SURGICAL HISTORY: Past Surgical History  Procedure Laterality Date  . Mastectomy    . Abdominal hysterectomy      FAMILY HISTORY No family history on file.     ADVANCED DIRECTIVES:    HEALTH MAINTENANCE: Social History  Substance Use Topics  . Smoking status: Never Smoker   . Smokeless tobacco: Never Used  . Alcohol Use: No     Allergies  Allergen Reactions  . Fosamax [Alendronate Sodium]   . Raloxifene     Other reaction(s): Unknown Other reaction(s): Unknown    Current Outpatient Prescriptions  Medication Sig Dispense Refill  . aspirin 81 MG tablet  Take 81 mg by mouth daily.    . calcium carbonate (CALCIUM 600) 600 MG TABS tablet Take by mouth.    . Cholecalciferol (VITAMIN D3) 1000 UNITS CAPS Take 1 capsule by mouth daily.    . Flaxseed, Linseed, (FLAX SEED OIL PO) Take 1 capsule by mouth.    Marland Kitchen GARLIC OIL PO Take 1 capsule by mouth daily.    Marland Kitchen glucosamine-chondroitin 500-400 MG tablet Take 1 tablet by mouth 3 (three) times daily.    . hydroxyurea (HYDREA) 500 MG capsule Take 1 capsule (500 mg total) by mouth daily. 30 capsule 3  . Lactobacillus (ACIDOPHILUS) 100 MG CAPS Take 2 capsules by mouth daily.    Marland Kitchen levothyroxine (SYNTHROID, LEVOTHROID) 75 MCG tablet     . lisinopril-hydrochlorothiazide (PRINZIDE,ZESTORETIC) 20-12.5 MG per tablet     . Multiple Vitamin (MULTIVITAMIN) capsule Take 1 capsule by mouth daily.    . Omega-3 Fatty Acids (FISH OIL) 1200 MG CAPS Take 1 capsule by mouth daily.    . propranolol (INDERAL) 10 MG tablet      No current facility-administered medications for this visit.    OBJECTIVE: Filed Vitals:   07/07/15 1058  BP: 165/77  Pulse: 68  Resp: 16     There is no height or weight on file to calculate BMI.    ECOG FS:1 - Symptomatic but completely ambulatory  General: Well-developed, well-nourished, no acute distress. Eyes: anicteric sclera. Lungs: Clear to auscultation bilaterally. Heart: Regular rate and rhythm. No rubs, murmurs, or gallops. Abdomen: Soft, nontender, nondistended.  No organomegaly noted, normoactive bowel sounds. Musculoskeletal: No edema, cyanosis, or clubbing. Neuro: Alert, answering all questions appropriately. Cranial nerves grossly intact. Skin: No rashes or petechiae noted. Psych: Normal affect.   LAB RESULTS:  Lab Results  Component Value Date   NA 142 02/26/2012   K 3.6 02/26/2012   CL 108* 02/26/2012   CO2 25 02/26/2012   GLUCOSE 101* 02/26/2012   BUN 22* 02/26/2012   CREATININE 0.87 02/26/2012   CALCIUM 8.7 02/26/2012   PROT 6.9 02/26/2012   ALBUMIN 3.7  02/26/2012   AST 23 02/26/2012   ALT 15 02/26/2012   ALKPHOS 122 02/26/2012   BILITOT 0.4 02/26/2012   GFRNONAA 59* 02/26/2012   GFRAA >60 02/26/2012    Lab Results  Component Value Date   WBC 5.2 07/07/2015   NEUTROABS 1.9 07/07/2015   HGB 12.7 07/07/2015   HCT 36.8 07/07/2015   MCV 108.7* 07/07/2015   PLT 322 07/07/2015     STUDIES: No results found.  ASSESSMENT: Clinical essential thrombocytosis.  PLAN:    1.  ET: Patient's platelet count continues to be within normal limits at 322 today.. Continue 500 mg Hydrea and 81 mg aspirin daily. Despite the fact that her JAK-2 mutation is negative, she clinically has essential thrombocytosis. To confirm would require bone marrow biopsy, which is not necessary at this point. Previously patient was taken off Hydrea temporarily at which time her platelet count increased significantly.  Return to clinic in 6 months with repeat laboratory work and further evaluation.   2. Elevated MCV: Secondary to Hydrea.  3. Hypertension: Blood pressure mildly elevated today, continue current medications as prescribed.   Patient expressed understanding and was in agreement with this plan. She also understands that She can call clinic at any time with any questions, concerns, or complaints.   Dr. Grayland Ormond was available for consultation and review of plan of care for this patient.  Mayra Reel, NP   07/07/2015 11:04 AM

## 2015-07-07 NOTE — Progress Notes (Signed)
Patient does not offer any problems today.  

## 2016-01-04 DIAGNOSIS — D473 Essential (hemorrhagic) thrombocythemia: Secondary | ICD-10-CM | POA: Insufficient documentation

## 2016-01-04 NOTE — Progress Notes (Deleted)
[place]  Telephone:(336) [phone] Fax:(336) [phone]  ID: Rachel Durham OB: 11/23/1920  MR#: [id number]  KYH#:[id number]  Patient Care Team: Madelyn Brunner, MD as PCP - General (Internal Medicine)  CHIEF COMPLAINT: Clinical essential thrombocytosis.  INTERVAL HISTORY: Patient returns to clinic today for repeat laboratory work and further evaluation.  She continues to feel well and is asymptomatic. She is tolerating Hydrea without significant side effects.  She has no neurologic complaints.  She has denies any recent fevers or illnesses.  She has a good appetite and denies weight loss.  She has no chest pain or shortness of breath.  She denies any nausea, vomiting, constipation, or diarrhea.  She has no urinary complaints.  Patient offers no specific complaints today.  REVIEW OF SYSTEMS:   Review of Systems  Constitutional: Negative.   Respiratory: Negative.   Cardiovascular: Negative.   Musculoskeletal: Negative.   Neurological: Negative.   Endo/Heme/Allergies: Does not bruise/bleed easily.  Psychiatric/Behavioral: Positive for memory loss.    As per HPI. Otherwise, a complete review of systems is negatve.  PAST MEDICAL HISTORY: Past Medical History:  Diagnosis Date  . Breast cancer   . Hyperlipidemia   . Hypertension   . Hypothyroidism     PAST SURGICAL HISTORY: Past Surgical History:  Procedure Laterality Date  . ABDOMINAL HYSTERECTOMY    . MASTECTOMY      FAMILY HISTORY No family history on file.     ADVANCED DIRECTIVES:    HEALTH MAINTENANCE: Social History  Substance Use Topics  . Smoking status: Never Smoker  . Smokeless tobacco: Never Used  . Alcohol use No     Allergies  Allergen Reactions  . Fosamax [Alendronate Sodium]   . Raloxifene     Other reaction(s): Unknown Other reaction(s): Unknown    Current Outpatient Prescriptions  Medication Sig Dispense Refill  . aspirin 81 MG tablet Take 81 mg by mouth daily.    .  calcium carbonate (CALCIUM 600) 600 MG TABS tablet Take by mouth.    . Cholecalciferol (VITAMIN D3) 1000 UNITS CAPS Take 1 capsule by mouth daily.    . Flaxseed, Linseed, (FLAX SEED OIL PO) Take 1 capsule by mouth.    Marland Kitchen GARLIC OIL PO Take 1 capsule by mouth daily.    Marland Kitchen glucosamine-chondroitin 500-400 MG tablet Take 1 tablet by mouth 3 (three) times daily.    . hydroxyurea (HYDREA) 500 MG capsule Take 1 capsule (500 mg total) by mouth daily. 90 capsule 3  . Lactobacillus (ACIDOPHILUS) 100 MG CAPS Take 2 capsules by mouth daily.    Marland Kitchen levothyroxine (SYNTHROID, LEVOTHROID) 75 MCG tablet     . lisinopril-hydrochlorothiazide (PRINZIDE,ZESTORETIC) 20-12.5 MG per tablet     . Multiple Vitamin (MULTIVITAMIN) capsule Take 1 capsule by mouth daily.    . Omega-3 Fatty Acids (FISH OIL) 1200 MG CAPS Take 1 capsule by mouth daily.    . propranolol (INDERAL) 10 MG tablet      No current facility-administered medications for this visit.     OBJECTIVE: There were no vitals filed for this visit.   There is no height or weight on file to calculate BMI.    ECOG FS:1 - Symptomatic but completely ambulatory  General: Well-developed, well-nourished, no acute distress. Eyes: anicteric sclera. Lungs: Clear to auscultation bilaterally. Heart: Regular rate and rhythm. No rubs, murmurs, or gallops. Abdomen: Soft, nontender, nondistended. No organomegaly noted, normoactive bowel sounds. Musculoskeletal: No edema, cyanosis, or clubbing. Neuro: Alert, answering all questions appropriately.  Cranial nerves grossly intact. Skin: No rashes or petechiae noted. Psych: Normal affect.   LAB RESULTS:  Lab Results  Component Value Date   NA 142 02/26/2012   K 3.6 02/26/2012   CL 108 (H) 02/26/2012   CO2 25 02/26/2012   GLUCOSE 101 (H) 02/26/2012   BUN 22 (H) 02/26/2012   CREATININE 0.87 02/26/2012   CALCIUM 8.7 02/26/2012   PROT 6.9 02/26/2012   ALBUMIN 3.7 02/26/2012   AST 23 02/26/2012   ALT 15 02/26/2012    ALKPHOS 122 02/26/2012   BILITOT 0.4 02/26/2012   GFRNONAA 59 (L) 02/26/2012   GFRAA >60 02/26/2012    Lab Results  Component Value Date   WBC 5.2 07/07/2015   NEUTROABS 1.9 07/07/2015   HGB 12.7 07/07/2015   HCT 36.8 07/07/2015   MCV 108.7 (H) 07/07/2015   PLT 322 07/07/2015     STUDIES: No results found.  ASSESSMENT: Clinical essential thrombocytosis.  PLAN:    1.  ET: Patient's platelet count continues to be within normal limits at 322 today.. Continue 500 mg Hydrea and 81 mg aspirin daily. Despite the fact that her JAK-2 mutation is negative, she clinically has essential thrombocytosis. To confirm would require bone marrow biopsy, which is not necessary at this point. Previously patient was taken off Hydrea temporarily at which time her platelet count increased significantly.  Return to clinic in 6 months with repeat laboratory work and further evaluation.   2. Elevated MCV: Secondary to Hydrea.  3. Hypertension: Blood pressure mildly elevated today, continue current medications as prescribed.   Patient expressed understanding and was in agreement with this plan. She also understands that She can call clinic at any time with any questions, concerns, or complaints.   Dr. Grayland Ormond was available for consultation and review of plan of care for this patient.  [name] Huger, MD   01/04/2016 9:19 PM

## 2016-01-05 ENCOUNTER — Inpatient Hospital Stay: Payer: Medicare Other | Admitting: Oncology

## 2016-01-05 ENCOUNTER — Inpatient Hospital Stay: Payer: Medicare Other

## 2016-07-16 ENCOUNTER — Inpatient Hospital Stay: Payer: Medicare Other

## 2016-07-16 ENCOUNTER — Inpatient Hospital Stay: Payer: Medicare Other | Admitting: Oncology

## 2016-08-02 ENCOUNTER — Other Ambulatory Visit: Payer: Self-pay | Admitting: *Deleted

## 2016-08-02 NOTE — Telephone Encounter (Addendum)
Requesting a refill of her Hydrea, Has not been seen since 07/07/15 and no labs have been drawn by [place] or PCP since 08/04/15. She had cancelled her appt in December 2017 and did call to reschedule it until May 29/18; then it got rescheduled from 07/16/16 to 08/09/16. She will be out of medicine by then. Please advise   Component Name 08/04/15 01/27/15 07/28/14 07/24/13  Platelet Count  Specimen: 350  Specimen: 377  Specimen: 333  Specimen: 318  Immature Granulocyte %  Specimen: 0.2  Specimen:0

## 2016-08-02 NOTE — Telephone Encounter (Signed)
Per VO Dr Grayland Ormond, no refill until she has her labs checked, waiting 1 week should not cause a problem. I notified Lorie of this and she was fine with it and said she thought her appt was in 2 weeks and that she has enough to last until 7/12 when she sees him.

## 2016-08-07 NOTE — Progress Notes (Signed)
[place]  [phone]) [phone] Fax:(336) [phone]  ID: Rachel Durham OB: 1920/09/14  MR#: [id number]  XVQ#:[id number]  Patient Care Team: Madelyn Brunner, MD as PCP - General (Internal Medicine)  CHIEF COMPLAINT: Clinical essential thrombocytosis.  INTERVAL HISTORY: Patient returns to clinic today for repeat laboratory work and further evaluation. She was last evaluated on July 07, 2015. She continues to feel well and is asymptomatic. She states she is tolerating Hydrea without significant side effects.  She has no neurologic complaints.  She has denies any recent fevers or illnesses.  She has a good appetite and denies weight loss.  She has no chest pain or shortness of breath.  She denies any nausea, vomiting, constipation, or diarrhea.  She has no urinary complaints.  Patient offers no specific complaints today.  REVIEW OF SYSTEMS:   Review of Systems  Constitutional: Negative.  Negative for fever, malaise/fatigue and weight loss.  Respiratory: Negative.  Negative for cough and shortness of breath.   Cardiovascular: Negative.  Negative for chest pain and leg swelling.  Gastrointestinal: Negative.  Negative for abdominal pain.  Genitourinary: Negative.   Musculoskeletal: Negative.   Skin: Negative.  Negative for rash.  Neurological: Negative.  Negative for sensory change and weakness.  Endo/Heme/Allergies: Does not bruise/bleed easily.  Psychiatric/Behavioral: Positive for memory loss. The patient is not nervous/anxious.     As per HPI. Otherwise, a complete review of systems is negative.  PAST MEDICAL HISTORY: Past Medical History:  Diagnosis Date  . Breast cancer   . Hyperlipidemia   . Hypertension   . Hypothyroidism     PAST SURGICAL HISTORY: Past Surgical History:  Procedure Laterality Date  . ABDOMINAL HYSTERECTOMY    . MASTECTOMY      FAMILY HISTORY: Reviewed and unchanged. No reported history of malignancy or chronic  disease.     ADVANCED DIRECTIVES:    HEALTH MAINTENANCE: Social History  Substance Use Topics  . Smoking status: Never Smoker  . Smokeless tobacco: Never Used  . Alcohol use No     Colonoscopy:  PAP:  Bone density:  Lipid panel:  Allergies  Allergen Reactions  . Fosamax [Alendronate Sodium]   . Raloxifene     Other reaction(s): Unknown Other reaction(s): Unknown    Current Outpatient Prescriptions  Medication Sig Dispense Refill  . aspirin 81 MG tablet Take 81 mg by mouth daily.    . calcium carbonate (CALCIUM 600) 600 MG TABS tablet Take by mouth.    . Cholecalciferol (VITAMIN D3) 1000 UNITS CAPS Take 1 capsule by mouth daily.    . Flaxseed, Linseed, (FLAX SEED OIL PO) Take 1 capsule by mouth.    Marland Kitchen GARLIC OIL PO Take 1 capsule by mouth daily.    Marland Kitchen glucosamine-chondroitin 500-400 MG tablet Take 1 tablet by mouth 3 (three) times daily.    . hydroxyurea (HYDREA) 500 MG capsule Take 1 capsule (500 mg total) by mouth daily. 90 capsule 3  . Lactobacillus (ACIDOPHILUS) 100 MG CAPS Take 2 capsules by mouth daily.    Marland Kitchen levothyroxine (SYNTHROID, LEVOTHROID) 75 MCG tablet     . lisinopril-hydrochlorothiazide (PRINZIDE,ZESTORETIC) 20-12.5 MG per tablet     . Multiple Vitamin (MULTIVITAMIN) capsule Take 1 capsule by mouth daily.    . Omega-3 Fatty Acids (FISH OIL) 1200 MG CAPS Take 1 capsule by mouth daily.    . propranolol (INDERAL) 10 MG tablet      No current facility-administered medications for this visit.     OBJECTIVE:  Vitals:   08/09/16 1517  BP: (!) 147/76  Pulse: 60  Resp: 20  Temp: (!) 97.4 F (36.3 C)     There is no height or weight on file to calculate BMI.    ECOG FS:1 - Symptomatic but completely ambulatory  General: Well-developed, well-nourished, no acute distress. Eyes: anicteric sclera. Lungs: Clear to auscultation bilaterally. Heart: Regular rate and rhythm. No rubs, murmurs, or gallops. Abdomen: Soft, nontender, nondistended. No organomegaly  noted, normoactive bowel sounds. Musculoskeletal: No edema, cyanosis, or clubbing. Neuro: Alert, answering all questions appropriately. Cranial nerves grossly intact. Skin: No rashes or petechiae noted. Psych: Normal affect.   LAB RESULTS:  Lab Results  Component Value Date   NA 142 02/26/2012   K 3.6 02/26/2012   CL 108 (H) 02/26/2012   CO2 25 02/26/2012   GLUCOSE 101 (H) 02/26/2012   BUN 22 (H) 02/26/2012   CREATININE 0.87 02/26/2012   CALCIUM 8.7 02/26/2012   PROT 6.9 02/26/2012   ALBUMIN 3.7 02/26/2012   AST 23 02/26/2012   ALT 15 02/26/2012   ALKPHOS 122 02/26/2012   BILITOT 0.4 02/26/2012   GFRNONAA 59 (L) 02/26/2012   GFRAA >60 02/26/2012    Lab Results  Component Value Date   WBC 6.4 08/09/2016   NEUTROABS 2.7 08/09/2016   HGB 12.8 08/09/2016   HCT 36.8 08/09/2016   MCV 105.9 (H) 08/09/2016   PLT 497 (H) 08/09/2016     STUDIES: No results found.  ASSESSMENT: Clinical essential thrombocytosis.  PLAN:    1. ET: Patient's platelet count has trended up slightly. It is possible patient has missed doses of Hydrea contributing to this. No intervention or changes are needed at this time. Continue 500 mg Hydrea and 81 mg aspirin daily. Despite the fact that her JAK-2 mutation is negative, she clinically has essential thrombocytosis. To confirm would require bone marrow biopsy, which is not necessary at this point. Previously patient was taken off Hydrea temporarily at which time her platelet count increased significantly.  Return to clinic in 6 months with repeat laboratory work and further evaluation.   2. Elevated MCV: Secondary to Hydrea.  3. Hypertension: Blood pressure mildly elevated today, continue current medications as prescribed.   Patient expressed understanding and was in agreement with this plan. She also understands that She can call clinic at any time with any questions, concerns, or complaints.    [name] Huger, MD   08/12/2016 7:45  AM

## 2016-08-09 ENCOUNTER — Inpatient Hospital Stay: Payer: Medicare Other | Attending: Oncology

## 2016-08-09 ENCOUNTER — Inpatient Hospital Stay (HOSPITAL_BASED_OUTPATIENT_CLINIC_OR_DEPARTMENT_OTHER): Payer: Medicare Other | Admitting: Oncology

## 2016-08-09 VITALS — BP 147/76 | HR 60 | Temp 97.4°F | Resp 20 | Wt 122.6 lb

## 2016-08-09 DIAGNOSIS — Z9011 Acquired absence of right breast and nipple: Secondary | ICD-10-CM

## 2016-08-09 DIAGNOSIS — E785 Hyperlipidemia, unspecified: Secondary | ICD-10-CM | POA: Insufficient documentation

## 2016-08-09 DIAGNOSIS — D473 Essential (hemorrhagic) thrombocythemia: Secondary | ICD-10-CM

## 2016-08-09 DIAGNOSIS — Z7982 Long term (current) use of aspirin: Secondary | ICD-10-CM

## 2016-08-09 DIAGNOSIS — Z79899 Other long term (current) drug therapy: Secondary | ICD-10-CM

## 2016-08-09 DIAGNOSIS — Z853 Personal history of malignant neoplasm of breast: Secondary | ICD-10-CM | POA: Insufficient documentation

## 2016-08-09 DIAGNOSIS — R7989 Other specified abnormal findings of blood chemistry: Secondary | ICD-10-CM | POA: Diagnosis not present

## 2016-08-09 DIAGNOSIS — E039 Hypothyroidism, unspecified: Secondary | ICD-10-CM | POA: Insufficient documentation

## 2016-08-09 DIAGNOSIS — I10 Essential (primary) hypertension: Secondary | ICD-10-CM | POA: Diagnosis not present

## 2016-08-09 LAB — CBC WITH DIFFERENTIAL/PLATELET
BASOS ABS: 0 10*3/uL (ref 0–0.1)
Basophils Relative: 0 %
EOS ABS: 0.1 10*3/uL (ref 0–0.7)
EOS PCT: 2 %
HCT: 36.8 % (ref 35.0–47.0)
Hemoglobin: 12.8 g/dL (ref 12.0–16.0)
LYMPHS PCT: 48 %
Lymphs Abs: 3 10*3/uL (ref 1.0–3.6)
MCH: 36.8 pg — ABNORMAL HIGH (ref 26.0–34.0)
MCHC: 34.8 g/dL (ref 32.0–36.0)
MCV: 105.9 fL — AB (ref 80.0–100.0)
Monocytes Absolute: 0.5 10*3/uL (ref 0.2–0.9)
Monocytes Relative: 8 %
NEUTROS PCT: 42 %
Neutro Abs: 2.7 10*3/uL (ref 1.4–6.5)
PLATELETS: 497 10*3/uL — AB (ref 150–440)
RBC: 3.48 MIL/uL — AB (ref 3.80–5.20)
RDW: 14.7 % — ABNORMAL HIGH (ref 11.5–14.5)
WBC: 6.4 10*3/uL (ref 3.6–11.0)

## 2016-08-09 MED ORDER — HYDROXYUREA 500 MG PO CAPS: 500.0000 mg | ORAL_CAPSULE | Freq: Every day | ORAL | 3 refills | Status: DC

## 2016-08-09 NOTE — Progress Notes (Signed)
Patient denies any concerns today.  

## 2017-02-10 NOTE — Progress Notes (Signed)
[place]  Telephone:(336) [phone] Fax:(336) [phone]  ID: Rachel Durham OB: [birth year]  MR#: [id number]  SEL#:[id number]  Patient Care Team: Madelyn Brunner, MD as PCP - General (Internal Medicine)  CHIEF COMPLAINT: Clinical essential thrombocytosis.  INTERVAL HISTORY: Patient returns to clinic today for repeat laboratory work and further evaluation.  She continues to have increased memory loss and much of the history is given by her husband and daughter. She continues to feel well and is asymptomatic. She is tolerating Hydrea without significant side effects.  She has no neurologic complaints.  She has denies any recent fevers or illnesses.  She has a good appetite and denies weight loss.  She has no chest pain or shortness of breath.  She denies any nausea, vomiting, constipation, or diarrhea.  She has no urinary complaints.  Patient offers no specific complaints today.  REVIEW OF SYSTEMS:   Review of Systems  Constitutional: Negative.  Negative for fever, malaise/fatigue and weight loss.  Respiratory: Negative.  Negative for cough and shortness of breath.   Cardiovascular: Negative.  Negative for chest pain and leg swelling.  Gastrointestinal: Negative.  Negative for abdominal pain.  Genitourinary: Negative.   Musculoskeletal: Negative.   Skin: Negative.  Negative for rash.  Neurological: Negative.  Negative for sensory change and weakness.  Endo/Heme/Allergies: Does not bruise/bleed easily.  Psychiatric/Behavioral: Positive for memory loss. The patient is not nervous/anxious.     As per HPI. Otherwise, a complete review of systems is negative.  PAST MEDICAL HISTORY: Past Medical History:  Diagnosis Date  . Breast cancer   . Hyperlipidemia   . Hypertension   . Hypothyroidism     PAST SURGICAL HISTORY: Left mastectomy.  FAMILY HISTORY: Reviewed and unchanged. No reported history of malignancy or chronic disease.     ADVANCED DIRECTIVES:     HEALTH MAINTENANCE: Social History   Tobacco Use  . Smoking status: Never Smoker  . Smokeless tobacco: Never Used  Substance Use Topics  . Alcohol use: No  . Drug use: No     Colonoscopy:  PAP:  Bone density:  Lipid panel:  Allergies  Allergen Reactions  . Fosamax [Alendronate Sodium]   . Raloxifene     Other reaction(s): Unknown Other reaction(s): Unknown    Current Outpatient Medications  Medication Sig Dispense Refill  . aspirin 81 MG tablet Take 81 mg by mouth daily.    . calcium carbonate (CALCIUM 600) 600 MG TABS tablet Take by mouth.    . Cholecalciferol (VITAMIN D3) 1000 UNITS CAPS Take 1 capsule by mouth daily.    . Flaxseed, Linseed, (FLAX SEED OIL PO) Take 1 capsule by mouth.    Marland Kitchen GARLIC OIL PO Take 1 capsule by mouth daily.    Marland Kitchen glucosamine-chondroitin 500-400 MG tablet Take 1 tablet by mouth 3 (three) times daily.    . hydroxyurea (HYDREA) 500 MG capsule Take 1 capsule (500 mg total) by mouth daily. 90 capsule 3  . Lactobacillus (ACIDOPHILUS) 100 MG CAPS Take 2 capsules by mouth daily.    Marland Kitchen levothyroxine (SYNTHROID, LEVOTHROID) 75 MCG tablet     . lisinopril-hydrochlorothiazide (PRINZIDE,ZESTORETIC) 20-12.5 MG per tablet     . Multiple Vitamin (MULTIVITAMIN) capsule Take 1 capsule by mouth daily.    . Omega-3 Fatty Acids (FISH OIL) 1200 MG CAPS Take 1 capsule by mouth daily.    . propranolol (INDERAL) 10 MG tablet      No current facility-administered medications for this visit.  OBJECTIVE: Vitals:   02/12/17 1422  BP: 140/82  Pulse: 87  Resp: 20  Temp: (!) 96.8 F (36 C)     There is no height or weight on file to calculate BMI.    ECOG FS:1 - Symptomatic but completely ambulatory  General: Well-developed, well-nourished, no acute distress. Eyes: anicteric sclera. Lungs: Clear to auscultation bilaterally. Heart: Regular rate and rhythm. No rubs, murmurs, or gallops. Abdomen: Soft, nontender, nondistended. No organomegaly noted,  normoactive bowel sounds. Musculoskeletal: No edema, cyanosis, or clubbing. Neuro: Alert, answering all questions appropriately. Cranial nerves grossly intact. Skin: No rashes or petechiae noted. Psych: Normal affect.   LAB RESULTS:  Lab Results  Component Value Date   NA 142 02/26/2012   K 3.6 02/26/2012   CL 108 (H) 02/26/2012   CO2 25 02/26/2012   GLUCOSE 101 (H) 02/26/2012   BUN 22 (H) 02/26/2012   CREATININE 0.87 02/26/2012   CALCIUM 8.7 02/26/2012   PROT 6.9 02/26/2012   ALBUMIN 3.7 02/26/2012   AST 23 02/26/2012   ALT 15 02/26/2012   ALKPHOS 122 02/26/2012   BILITOT 0.4 02/26/2012   GFRNONAA 59 (L) 02/26/2012   GFRAA >60 02/26/2012    Lab Results  Component Value Date   WBC 6.3 02/12/2017   NEUTROABS 3.5 02/12/2017   HGB 13.0 02/12/2017   HCT 38.6 02/12/2017   MCV 109.7 (H) 02/12/2017   PLT 545 (H) 02/12/2017     STUDIES: No results found.  ASSESSMENT: Clinical essential thrombocytosis.  PLAN:    1. ET: Patient's platelet count remains mildly elevated, but essentially stable. No intervention or changes are needed at this time. Continue 500 mg Hydrea and 81 mg aspirin daily. Despite the fact that her JAK-2 mutation is negative, she clinically has essential thrombocytosis. To confirm would require bone marrow biopsy, which is not necessary at this point. Previously patient was taken off Hydrea temporarily at which time her platelet count increased significantly greater than 1000.  Return to clinic in 6 months with repeat laboratory work and further evaluation.   2. Elevated MCV: Secondary to Hydrea.  3. Hypertension: Blood pressure mildly elevated today, continue current medications as prescribed.   Patient expressed understanding and was in agreement with this plan. She also understands that She can call clinic at any time with any questions, concerns, or complaints.    [name] Huger, MD   02/16/2017 7:04 AM

## 2017-02-12 ENCOUNTER — Inpatient Hospital Stay (HOSPITAL_BASED_OUTPATIENT_CLINIC_OR_DEPARTMENT_OTHER): Payer: Medicare Other | Admitting: Oncology

## 2017-02-12 ENCOUNTER — Inpatient Hospital Stay: Payer: Medicare Other | Attending: Oncology

## 2017-02-12 ENCOUNTER — Other Ambulatory Visit: Payer: Self-pay

## 2017-02-12 VITALS — BP 140/82 | HR 87 | Temp 96.8°F | Resp 20 | Wt 122.0 lb

## 2017-02-12 DIAGNOSIS — E039 Hypothyroidism, unspecified: Secondary | ICD-10-CM

## 2017-02-12 DIAGNOSIS — Z7982 Long term (current) use of aspirin: Secondary | ICD-10-CM

## 2017-02-12 DIAGNOSIS — Z79899 Other long term (current) drug therapy: Secondary | ICD-10-CM

## 2017-02-12 DIAGNOSIS — D473 Essential (hemorrhagic) thrombocythemia: Secondary | ICD-10-CM | POA: Diagnosis present

## 2017-02-12 DIAGNOSIS — R413 Other amnesia: Secondary | ICD-10-CM | POA: Diagnosis not present

## 2017-02-12 DIAGNOSIS — I10 Essential (primary) hypertension: Secondary | ICD-10-CM | POA: Insufficient documentation

## 2017-02-12 DIAGNOSIS — R7989 Other specified abnormal findings of blood chemistry: Secondary | ICD-10-CM

## 2017-02-12 DIAGNOSIS — Z9012 Acquired absence of left breast and nipple: Secondary | ICD-10-CM | POA: Diagnosis not present

## 2017-02-12 DIAGNOSIS — E785 Hyperlipidemia, unspecified: Secondary | ICD-10-CM | POA: Insufficient documentation

## 2017-02-12 LAB — CBC WITH DIFFERENTIAL/PLATELET
BASOS PCT: 0 %
Basophils Absolute: 0 10*3/uL (ref 0–0.1)
EOS ABS: 0 10*3/uL (ref 0–0.7)
Eosinophils Relative: 1 %
HCT: 38.6 % (ref 35.0–47.0)
Hemoglobin: 13 g/dL (ref 12.0–16.0)
Lymphocytes Relative: 35 %
Lymphs Abs: 2.2 10*3/uL (ref 1.0–3.6)
MCH: 37.1 pg — ABNORMAL HIGH (ref 26.0–34.0)
MCHC: 33.8 g/dL (ref 32.0–36.0)
MCV: 109.7 fL — ABNORMAL HIGH (ref 80.0–100.0)
MONO ABS: 0.5 10*3/uL (ref 0.2–0.9)
MONOS PCT: 8 %
Neutro Abs: 3.5 10*3/uL (ref 1.4–6.5)
Neutrophils Relative %: 56 %
PLATELETS: 545 10*3/uL — AB (ref 150–440)
RBC: 3.52 MIL/uL — ABNORMAL LOW (ref 3.80–5.20)
RDW: 14.6 % — AB (ref 11.5–14.5)
WBC: 6.3 10*3/uL (ref 3.6–11.0)

## 2017-02-12 NOTE — Progress Notes (Signed)
Patient denies any concerns today.  

## 2017-07-17 ENCOUNTER — Other Ambulatory Visit: Payer: Self-pay | Admitting: Oncology

## 2017-07-17 DIAGNOSIS — D473 Essential (hemorrhagic) thrombocythemia: Secondary | ICD-10-CM

## 2017-08-27 ENCOUNTER — Other Ambulatory Visit: Payer: Medicare Other

## 2017-08-27 ENCOUNTER — Ambulatory Visit: Payer: Medicare Other | Admitting: Oncology

## 2017-09-02 NOTE — Progress Notes (Signed)
[place]  Telephone:(336) [phone] Fax:(336) [phone]  ID: Rachel Durham OB: [birth year]  MR#: [id number]  WER#:[id number]  Patient Care Team: Madelyn Brunner, MD as PCP - General (Internal Medicine)  CHIEF COMPLAINT: Clinical essential thrombocytosis.  INTERVAL HISTORY: Patient returns to clinic today for routine six-month follow-up.  She continues to have difficulty with her memory and much of the history is given by her daughter.  She currently feels well and is asymptomatic.  She continues to tolerate Hydrea without significant side effects.  She has no neurologic complaints.  She has denies any recent fevers or illnesses.  She has a good appetite and denies weight loss.  She has no chest pain or shortness of breath.  She denies any nausea, vomiting, constipation, or diarrhea.  She has no urinary complaints.  Patient offers no specific complaints today.  REVIEW OF SYSTEMS:   Review of Systems  Constitutional: Negative.  Negative for fever, malaise/fatigue and weight loss.  Respiratory: Negative.  Negative for cough and shortness of breath.   Cardiovascular: Negative.  Negative for chest pain and leg swelling.  Gastrointestinal: Negative.  Negative for abdominal pain.  Genitourinary: Negative.  Negative for dysuria.  Musculoskeletal: Negative.  Negative for myalgias.  Skin: Negative.  Negative for rash.  Neurological: Negative.  Negative for sensory change, focal weakness, weakness and headaches.  Endo/Heme/Allergies: Does not bruise/bleed easily.  Psychiatric/Behavioral: Positive for memory loss. The patient is not nervous/anxious.     As per HPI. Otherwise, a complete review of systems is negative.  PAST MEDICAL HISTORY: Past Medical History:  Diagnosis Date  . Breast cancer ([place])   . Hyperlipidemia   . Hypertension   . Hypothyroidism     PAST SURGICAL HISTORY: Left mastectomy.  FAMILY HISTORY: Reviewed and unchanged. No reported history of  malignancy or chronic disease.     ADVANCED DIRECTIVES:    HEALTH MAINTENANCE: Social History   Tobacco Use  . Smoking status: Never Smoker  . Smokeless tobacco: Never Used  Substance Use Topics  . Alcohol use: No  . Drug use: No     Colonoscopy:  PAP:  Bone density:  Lipid panel:  Allergies  Allergen Reactions  . Fosamax [Alendronate Sodium]   . Raloxifene     Other reaction(s): Unknown Other reaction(s): Unknown    Current Outpatient Medications  Medication Sig Dispense Refill  . aspirin 81 MG tablet Take 81 mg by mouth daily.    . calcium carbonate (CALCIUM 600) 600 MG TABS tablet Take by mouth.    . Cholecalciferol (VITAMIN D3) 1000 UNITS CAPS Take 1 capsule by mouth daily.    . Flaxseed, Linseed, (FLAX SEED OIL PO) Take 1 capsule by mouth.    Marland Kitchen GARLIC OIL PO Take 1 capsule by mouth daily.    Marland Kitchen glucosamine-chondroitin 500-400 MG tablet Take 1 tablet by mouth 3 (three) times daily.    . hydroxyurea (HYDREA) 500 MG capsule TAKE 1 CAPSULE (500 MG TOTAL) BY MOUTH DAILY. 90 capsule 3  . Lactobacillus (ACIDOPHILUS) 100 MG CAPS Take 2 capsules by mouth daily.    Marland Kitchen levothyroxine (SYNTHROID, LEVOTHROID) 75 MCG tablet     . lisinopril-hydrochlorothiazide (PRINZIDE,ZESTORETIC) 20-12.5 MG per tablet     . Multiple Vitamin (MULTIVITAMIN) capsule Take 1 capsule by mouth daily.    . Omega-3 Fatty Acids (FISH OIL) 1200 MG CAPS Take 1 capsule by mouth daily.    . propranolol (INDERAL) 10 MG tablet      No current facility-administered  medications for this visit.     OBJECTIVE: Vitals:   09/05/17 1505  BP: 115/65  Pulse: 84  Resp: 18  Temp: (!) 97.3 F (36.3 C)     There is no height or weight on file to calculate BMI.    ECOG FS:1 - Symptomatic but completely ambulatory  General: Thin, no acute distress. Eyes: Pink conjunctiva, anicteric sclera. HEENT: Normocephalic, moist mucous membranes. Lungs: Clear to auscultation bilaterally. Heart: Regular rate and rhythm. No  rubs, murmurs, or gallops. Abdomen: Soft, nontender, nondistended. No organomegaly noted, normoactive bowel sounds. Musculoskeletal: No edema, cyanosis, or clubbing. Neuro: Alert, answering all questions appropriately. Cranial nerves grossly intact. Skin: No rashes or petechiae noted. Psych: Normal affect.   LAB RESULTS:  Lab Results  Component Value Date   NA 142 02/26/2012   K 3.6 02/26/2012   CL 108 (H) 02/26/2012   CO2 25 02/26/2012   GLUCOSE 101 (H) 02/26/2012   BUN 22 (H) 02/26/2012   CREATININE 0.87 02/26/2012   CALCIUM 8.7 02/26/2012   PROT 6.9 02/26/2012   ALBUMIN 3.7 02/26/2012   AST 23 02/26/2012   ALT 15 02/26/2012   ALKPHOS 122 02/26/2012   BILITOT 0.4 02/26/2012   GFRNONAA 59 (L) 02/26/2012   GFRAA >60 02/26/2012    Lab Results  Component Value Date   WBC 6.7 09/05/2017   NEUTROABS 3.0 09/05/2017   HGB 12.5 09/05/2017   HCT 36.3 09/05/2017   MCV 110.8 (H) 09/05/2017   PLT 531 (H) 09/05/2017     STUDIES: No results found.  ASSESSMENT: Clinical essential thrombocytosis.  PLAN:    1. ET: Patient's platelet count remains mildly elevated at 531, but essentially unchanged from previous. No intervention or changes are needed at this time. Continue 500 mg Hydrea and 81 mg aspirin daily. Despite the fact that her JAK-2 mutation is negative, she clinically has essential thrombocytosis. To confirm would require bone marrow biopsy, which is not necessary at this point. Previously patient was taken off Hydrea temporarily at which time her platelet count increased significantly greater than 1000.  Return to clinic in 6 months with repeat laboratory work and further evaluation.   2. Elevated MCV: Chronic and unchanged.  Secondary to Hydrea.  3. Hypertension: Blood pressure is within normal limits today.  Continue monitoring and treatment per primary care.  I spent a total of 20 minutes face-to-face with the patient of which greater than 50% of the visit was spent in  counseling and coordination of care as detailed above.  Patient expressed understanding and was in agreement with this plan. She also understands that She can call clinic at any time with any questions, concerns, or complaints.    [name] Huger, MD   09/08/2017 9:19 AM

## 2017-09-05 ENCOUNTER — Inpatient Hospital Stay (HOSPITAL_BASED_OUTPATIENT_CLINIC_OR_DEPARTMENT_OTHER): Payer: Medicare Other | Admitting: Oncology

## 2017-09-05 ENCOUNTER — Inpatient Hospital Stay: Payer: Medicare Other | Attending: Oncology

## 2017-09-05 ENCOUNTER — Encounter: Payer: Self-pay | Admitting: Oncology

## 2017-09-05 VITALS — BP 115/65 | HR 84 | Temp 97.3°F | Resp 18 | Wt 122.0 lb

## 2017-09-05 DIAGNOSIS — Z7982 Long term (current) use of aspirin: Secondary | ICD-10-CM | POA: Insufficient documentation

## 2017-09-05 DIAGNOSIS — D473 Essential (hemorrhagic) thrombocythemia: Secondary | ICD-10-CM | POA: Insufficient documentation

## 2017-09-05 DIAGNOSIS — R7989 Other specified abnormal findings of blood chemistry: Secondary | ICD-10-CM | POA: Insufficient documentation

## 2017-09-05 DIAGNOSIS — Z79899 Other long term (current) drug therapy: Secondary | ICD-10-CM | POA: Diagnosis not present

## 2017-09-05 DIAGNOSIS — I10 Essential (primary) hypertension: Secondary | ICD-10-CM

## 2017-09-05 LAB — CBC WITH DIFFERENTIAL/PLATELET
BASOS PCT: 0 %
Basophils Absolute: 0 10*3/uL (ref 0–0.1)
EOS ABS: 0.1 10*3/uL (ref 0–0.7)
EOS PCT: 2 %
HCT: 36.3 % (ref 35.0–47.0)
Hemoglobin: 12.5 g/dL (ref 12.0–16.0)
LYMPHS ABS: 3.1 10*3/uL (ref 1.0–3.6)
Lymphocytes Relative: 46 %
MCH: 38.3 pg — AB (ref 26.0–34.0)
MCHC: 34.6 g/dL (ref 32.0–36.0)
MCV: 110.8 fL — ABNORMAL HIGH (ref 80.0–100.0)
MONOS PCT: 7 %
Monocytes Absolute: 0.4 10*3/uL (ref 0.2–0.9)
NEUTROS PCT: 45 %
Neutro Abs: 3 10*3/uL (ref 1.4–6.5)
PLATELETS: 531 10*3/uL — AB (ref 150–440)
RBC: 3.27 MIL/uL — ABNORMAL LOW (ref 3.80–5.20)
RDW: 14.5 % (ref 11.5–14.5)
WBC: 6.7 10*3/uL (ref 3.6–11.0)

## 2017-09-05 NOTE — Progress Notes (Signed)
Pt in for follow up, denies any difficulties or concerns.  

## 2018-02-28 NOTE — Progress Notes (Deleted)
[place]  Telephone:(336) [phone] Fax:(336) [phone]  ID: Rachel Durham OB: 08/21/1920  MR#: [id number]  ERD#:[id number]  Patient Care Team: Madelyn Brunner, MD as PCP - General (Internal Medicine)  CHIEF COMPLAINT: Clinical essential thrombocytosis.  INTERVAL HISTORY: Patient returns to clinic today for routine six-month follow-up.  She continues to have difficulty with her memory and much of the history is given by her daughter.  She currently feels well and is asymptomatic.  She continues to tolerate Hydrea without significant side effects.  She has no neurologic complaints.  She has denies any recent fevers or illnesses.  She has a good appetite and denies weight loss.  She has no chest pain or shortness of breath.  She denies any nausea, vomiting, constipation, or diarrhea.  She has no urinary complaints.  Patient offers no specific complaints today.  REVIEW OF SYSTEMS:   Review of Systems  Constitutional: Negative.  Negative for fever, malaise/fatigue and weight loss.  Respiratory: Negative.  Negative for cough and shortness of breath.   Cardiovascular: Negative.  Negative for chest pain and leg swelling.  Gastrointestinal: Negative.  Negative for abdominal pain.  Genitourinary: Negative.  Negative for dysuria.  Musculoskeletal: Negative.  Negative for myalgias.  Skin: Negative.  Negative for rash.  Neurological: Negative.  Negative for sensory change, focal weakness, weakness and headaches.  Endo/Heme/Allergies: Does not bruise/bleed easily.  Psychiatric/Behavioral: Positive for memory loss. The patient is not nervous/anxious.     As per HPI. Otherwise, a complete review of systems is negative.  PAST MEDICAL HISTORY: Past Medical History:  Diagnosis Date  . Breast cancer ([place])   . Hyperlipidemia   . Hypertension   . Hypothyroidism     PAST SURGICAL HISTORY: Left mastectomy.  FAMILY HISTORY: Reviewed and unchanged. No reported history of  malignancy or chronic disease.     ADVANCED DIRECTIVES:    HEALTH MAINTENANCE: Social History   Tobacco Use  . Smoking status: Never Smoker  . Smokeless tobacco: Never Used  Substance Use Topics  . Alcohol use: No  . Drug use: No     Colonoscopy:  PAP:  Bone density:  Lipid panel:  Allergies  Allergen Reactions  . Fosamax [Alendronate Sodium]   . Raloxifene     Other reaction(s): Unknown Other reaction(s): Unknown    Current Outpatient Medications  Medication Sig Dispense Refill  . aspirin 81 MG tablet Take 81 mg by mouth daily.    . calcium carbonate (CALCIUM 600) 600 MG TABS tablet Take by mouth.    . Cholecalciferol (VITAMIN D3) 1000 UNITS CAPS Take 1 capsule by mouth daily.    . Flaxseed, Linseed, (FLAX SEED OIL PO) Take 1 capsule by mouth.    Marland Kitchen GARLIC OIL PO Take 1 capsule by mouth daily.    Marland Kitchen glucosamine-chondroitin 500-400 MG tablet Take 1 tablet by mouth 3 (three) times daily.    . hydroxyurea (HYDREA) 500 MG capsule TAKE 1 CAPSULE (500 MG TOTAL) BY MOUTH DAILY. 90 capsule 3  . Lactobacillus (ACIDOPHILUS) 100 MG CAPS Take 2 capsules by mouth daily.    Marland Kitchen levothyroxine (SYNTHROID, LEVOTHROID) 75 MCG tablet     . lisinopril-hydrochlorothiazide (PRINZIDE,ZESTORETIC) 20-12.5 MG per tablet     . Multiple Vitamin (MULTIVITAMIN) capsule Take 1 capsule by mouth daily.    . Omega-3 Fatty Acids (FISH OIL) 1200 MG CAPS Take 1 capsule by mouth daily.    . propranolol (INDERAL) 10 MG tablet      No current facility-administered  medications for this visit.     OBJECTIVE: There were no vitals filed for this visit.   There is no height or weight on file to calculate BMI.    ECOG FS:1 - Symptomatic but completely ambulatory  General: Thin, no acute distress. Eyes: Pink conjunctiva, anicteric sclera. HEENT: Normocephalic, moist mucous membranes. Lungs: Clear to auscultation bilaterally. Heart: Regular rate and rhythm. No rubs, murmurs, or gallops. Abdomen: Soft,  nontender, nondistended. No organomegaly noted, normoactive bowel sounds. Musculoskeletal: No edema, cyanosis, or clubbing. Neuro: Alert, answering all questions appropriately. Cranial nerves grossly intact. Skin: No rashes or petechiae noted. Psych: Normal affect.   LAB RESULTS:  Lab Results  Component Value Date   NA 142 02/26/2012   K 3.6 02/26/2012   CL 108 (H) 02/26/2012   CO2 25 02/26/2012   GLUCOSE 101 (H) 02/26/2012   BUN 22 (H) 02/26/2012   CREATININE 0.87 02/26/2012   CALCIUM 8.7 02/26/2012   PROT 6.9 02/26/2012   ALBUMIN 3.7 02/26/2012   AST 23 02/26/2012   ALT 15 02/26/2012   ALKPHOS 122 02/26/2012   BILITOT 0.4 02/26/2012   GFRNONAA 59 (L) 02/26/2012   GFRAA >60 02/26/2012    Lab Results  Component Value Date   WBC 6.7 09/05/2017   NEUTROABS 3.0 09/05/2017   HGB 12.5 09/05/2017   HCT 36.3 09/05/2017   MCV 110.8 (H) 09/05/2017   PLT 531 (H) 09/05/2017     STUDIES: No results found.  ASSESSMENT: Clinical essential thrombocytosis.  PLAN:    1. ET: Patient's platelet count remains mildly elevated at 531, but essentially unchanged from previous. No intervention or changes are needed at this time. Continue 500 mg Hydrea and 81 mg aspirin daily. Despite the fact that her JAK-2 mutation is negative, she clinically has essential thrombocytosis. To confirm would require bone marrow biopsy, which is not necessary at this point. Previously patient was taken off Hydrea temporarily at which time her platelet count increased significantly greater than 1000.  Return to clinic in 6 months with repeat laboratory work and further evaluation.   2. Elevated MCV: Chronic and unchanged.  Secondary to Hydrea.  3. Hypertension: Blood pressure is within normal limits today.  Continue monitoring and treatment per primary care.  I spent a total of 20 minutes face-to-face with the patient of which greater than 50% of the visit was spent in counseling and coordination of care as  detailed above.  Patient expressed understanding and was in agreement with this plan. She also understands that She can call clinic at any time with any questions, concerns, or complaints.    [name] Huger, MD   02/28/2018 11:47 AM

## 2018-03-06 ENCOUNTER — Inpatient Hospital Stay: Payer: Medicare Other | Admitting: Oncology

## 2018-03-06 ENCOUNTER — Inpatient Hospital Stay: Payer: Medicare Other

## 2018-03-07 ENCOUNTER — Other Ambulatory Visit: Payer: Self-pay | Admitting: *Deleted

## 2018-03-07 DIAGNOSIS — D473 Essential (hemorrhagic) thrombocythemia: Secondary | ICD-10-CM

## 2018-03-07 NOTE — Progress Notes (Signed)
[place]  Telephone:(336) [phone] Fax:(336) [phone]  ID: Rachel Durham OB: [birth year]  MR#: [id number]  VWP#:[id number]  Patient Care Team: Madelyn Brunner, MD as PCP - General (Internal Medicine)  CHIEF COMPLAINT: Clinical essential thrombocytosis.  INTERVAL HISTORY: Patient returns to clinic today for repeat laboratory work and routine 84-monthfollow-up. She continues to have difficulty with her memory and much of the history is given by her daughter.  She currently feels well and is asymptomatic.  She is tolerating Hydrea well without significant side effects.  She has no neurologic complaints.  She has denies any recent fevers or illnesses.  She has a good appetite and denies weight loss.  She has no chest pain or shortness of breath.  She denies any nausea, vomiting, constipation, or diarrhea.  She has no urinary complaints.  Patient feels at her baseline offers no specific complaints today.  REVIEW OF SYSTEMS:   Review of Systems  Constitutional: Negative.  Negative for fever, malaise/fatigue and weight loss.  Respiratory: Negative.  Negative for cough and shortness of breath.   Cardiovascular: Negative.  Negative for chest pain and leg swelling.  Gastrointestinal: Negative.  Negative for abdominal pain.  Genitourinary: Negative.  Negative for dysuria.  Musculoskeletal: Negative.  Negative for myalgias.  Skin: Negative.  Negative for rash.  Neurological: Negative.  Negative for sensory change, focal weakness, weakness and headaches.  Endo/Heme/Allergies: Does not bruise/bleed easily.  Psychiatric/Behavioral: Positive for memory loss. The patient is not nervous/anxious.     As per HPI. Otherwise, a complete review of systems is negative.  PAST MEDICAL HISTORY: Past Medical History:  Diagnosis Date  . [place]   . Hyperlipidemia   . Hypertension   . Hypothyroidism     PAST SURGICAL HISTORY: Left mastectomy.  FAMILY HISTORY: Reviewed  and unchanged. No reported history of malignancy or chronic disease.     ADVANCED DIRECTIVES:    HEALTH MAINTENANCE: Social History   Tobacco Use  . Smoking status: Never Smoker  . Smokeless tobacco: Never Used  Substance Use Topics  . Alcohol use: No  . Drug use: No     Colonoscopy:  PAP:  Bone density:  Lipid panel:  Allergies  Allergen Reactions  . Fosamax [Alendronate Sodium]   . Raloxifene     Other reaction(s): Unknown Other reaction(s): Unknown    Current Outpatient Medications  Medication Sig Dispense Refill  . aspirin 81 MG tablet Take 81 mg by mouth daily.    . calcium carbonate (CALCIUM 600) 600 MG TABS tablet Take by mouth.    . Cholecalciferol (VITAMIN D3) 1000 UNITS CAPS Take 1 capsule by mouth daily.    . Flaxseed, Linseed, (FLAX SEED OIL PO) Take 1 capsule by mouth.    .Marland KitchenGARLIC OIL PO Take 1 capsule by mouth daily.    .Marland Kitchenglucosamine-chondroitin 500-400 MG tablet Take 1 tablet by mouth 3 (three) times daily.    . hydroxyurea (HYDREA) 500 MG capsule TAKE 1 CAPSULE (500 MG TOTAL) BY MOUTH DAILY. 90 capsule 3  . Lactobacillus (ACIDOPHILUS) 100 MG CAPS Take 2 capsules by mouth daily.    .Marland Kitchenlevothyroxine (SYNTHROID, LEVOTHROID) 75 MCG tablet     . lisinopril-hydrochlorothiazide (PRINZIDE,ZESTORETIC) 20-12.5 MG per tablet     . Multiple Vitamin (MULTIVITAMIN) capsule Take 1 capsule by mouth daily.    . Multiple Vitamins-Minerals (MULTIVITAMIN ADULTS PO) Take by mouth.    . Omega-3 Fatty Acids (FISH OIL) 1200 MG CAPS Take 1 capsule by  mouth daily.    . propranolol (INDERAL) 10 MG tablet      No current facility-administered medications for this visit.     OBJECTIVE: Vitals:   03/10/18 1414  BP: 125/72  Pulse: 62  Temp: 97.6 F (36.4 C)     Body mass index is 21.56 kg/m.    ECOG FS:1 - Symptomatic but completely ambulatory  General: Thin, no acute distress. Eyes: Pink conjunctiva, anicteric sclera. HEENT: Normocephalic, moist mucous membranes, clear  oropharnyx. Lungs: Clear to auscultation bilaterally. Heart: Regular rate and rhythm. No rubs, murmurs, or gallops. Abdomen: Soft, nontender, nondistended. No organomegaly noted, normoactive bowel sounds. Musculoskeletal: No edema, cyanosis, or clubbing. Neuro: Alert, answering all questions appropriately. Cranial nerves grossly intact. Skin: No rashes or petechiae noted. Psych: Normal affect.  LAB RESULTS:  Lab Results  Component Value Date   NA 142 02/26/2012   K 3.6 02/26/2012   CL 108 (H) 02/26/2012   CO2 25 02/26/2012   GLUCOSE 101 (H) 02/26/2012   BUN 22 (H) 02/26/2012   CREATININE 0.87 02/26/2012   CALCIUM 8.7 02/26/2012   PROT 6.9 02/26/2012   ALBUMIN 3.7 02/26/2012   AST 23 02/26/2012   ALT 15 02/26/2012   ALKPHOS 122 02/26/2012   BILITOT 0.4 02/26/2012   GFRNONAA 59 (L) 02/26/2012   GFRAA >60 02/26/2012    Lab Results  Component Value Date   WBC 6.2 03/10/2018   NEUTROABS 3.1 03/10/2018   HGB 11.9 (L) 03/10/2018   HCT 35.0 (L) 03/10/2018   MCV 107.4 (H) 03/10/2018   PLT 572 (H) 03/10/2018     STUDIES: No results found.  ASSESSMENT: Clinical essential thrombocytosis.  PLAN:    1. ET: Patient's platelet count remains mildly elevated at 572, but essentially unchanged. No intervention or changes are needed at this time. Continue 500 mg Hydrea and 81 mg aspirin daily. Despite the fact that her JAK-2 mutation is negative, she clinically has essential thrombocytosis. To confirm would require bone marrow biopsy, which is not necessary at this point. Previously patient was taken off Hydrea temporarily at which time her platelet count increased significantly greater than 1000.  Return to clinic in 6 months with repeat laboratory work and routine evaluation.   2. Elevated MCV: Chronic and unchanged.  Secondary to Hydrea.  3. Hypertension: Blood pressure continues to be within normal limits.  Continue monitoring and treatment per primary care.  I spent a total of 20  minutes face-to-face with the patient of which greater than 50% of the visit was spent in counseling and coordination of care as detailed above.  Patient expressed understanding and was in agreement with this plan. She also understands that She can call clinic at any time with any questions, concerns, or complaints.    [name] Huger, MD   03/11/2018 6:46 AM

## 2018-03-10 ENCOUNTER — Other Ambulatory Visit: Payer: Self-pay

## 2018-03-10 ENCOUNTER — Inpatient Hospital Stay: Payer: Medicare Other | Attending: Oncology

## 2018-03-10 ENCOUNTER — Inpatient Hospital Stay (HOSPITAL_BASED_OUTPATIENT_CLINIC_OR_DEPARTMENT_OTHER): Payer: Medicare Other | Admitting: Oncology

## 2018-03-10 VITALS — BP 125/72 | HR 62 | Temp 97.6°F | Ht 63.0 in | Wt 121.7 lb

## 2018-03-10 DIAGNOSIS — E039 Hypothyroidism, unspecified: Secondary | ICD-10-CM | POA: Insufficient documentation

## 2018-03-10 DIAGNOSIS — E785 Hyperlipidemia, unspecified: Secondary | ICD-10-CM | POA: Insufficient documentation

## 2018-03-10 DIAGNOSIS — D473 Essential (hemorrhagic) thrombocythemia: Secondary | ICD-10-CM | POA: Insufficient documentation

## 2018-03-10 DIAGNOSIS — Z79899 Other long term (current) drug therapy: Secondary | ICD-10-CM | POA: Diagnosis not present

## 2018-03-10 DIAGNOSIS — Z7982 Long term (current) use of aspirin: Secondary | ICD-10-CM | POA: Insufficient documentation

## 2018-03-10 DIAGNOSIS — R718 Other abnormality of red blood cells: Secondary | ICD-10-CM

## 2018-03-10 DIAGNOSIS — I10 Essential (primary) hypertension: Secondary | ICD-10-CM | POA: Diagnosis not present

## 2018-03-10 LAB — CBC WITH DIFFERENTIAL/PLATELET
Abs Immature Granulocytes: 0.02 10*3/uL (ref 0.00–0.07)
BASOS PCT: 0 %
Basophils Absolute: 0 10*3/uL (ref 0.0–0.1)
EOS ABS: 0.2 10*3/uL (ref 0.0–0.5)
Eosinophils Relative: 2 %
HCT: 35 % — ABNORMAL LOW (ref 36.0–46.0)
Hemoglobin: 11.9 g/dL — ABNORMAL LOW (ref 12.0–15.0)
Immature Granulocytes: 0 %
Lymphocytes Relative: 39 %
Lymphs Abs: 2.4 10*3/uL (ref 0.7–4.0)
MCH: 36.5 pg — AB (ref 26.0–34.0)
MCHC: 34 g/dL (ref 30.0–36.0)
MCV: 107.4 fL — AB (ref 80.0–100.0)
MONO ABS: 0.5 10*3/uL (ref 0.1–1.0)
MONOS PCT: 9 %
Neutro Abs: 3.1 10*3/uL (ref 1.7–7.7)
Neutrophils Relative %: 50 %
PLATELETS: 572 10*3/uL — AB (ref 150–400)
RBC: 3.26 MIL/uL — AB (ref 3.87–5.11)
RDW: 14.6 % (ref 11.5–15.5)
WBC: 6.2 10*3/uL (ref 4.0–10.5)
nRBC: 0 % (ref 0.0–0.2)

## 2018-03-10 NOTE — Progress Notes (Signed)
Patient is here today to follow up on her essential thrombocytosis. Patient stated that she had been doing well with no complaints.

## 2018-06-15 ENCOUNTER — Other Ambulatory Visit: Payer: Self-pay | Admitting: Oncology

## 2018-06-15 DIAGNOSIS — D473 Essential (hemorrhagic) thrombocythemia: Secondary | ICD-10-CM

## 2018-06-16 ENCOUNTER — Telehealth: Payer: Self-pay | Admitting: *Deleted

## 2018-06-16 NOTE — Telephone Encounter (Signed)
)     Ref Range & Units 68mo ago (03/10/18) 64mo ago (09/05/17) 4yr ago (02/12/17) 8yr ago (08/09/16) 29yr ago (07/07/15) 6yr ago (01/06/15) 64yr ago (10/07/14)  WBC 4.0 - 10.5 K/uL 6.2  6.7 R 6.3 R 6.4 R 5.2 R 4.9 R 5.4 R  RBC 3.87 - 5.11 MIL/uL 3.26Low   3.27Low  R 3.52Low  R 3.48Low  R 3.39Low  R 3.37Low  R 3.32Low  R  Hemoglobin 12.0 - 15.0 g/dL 11.9Low   12.5 R 13.0 R 12.8 R 12.7 R 12.6 R 12.6 R  HCT 36.0 - 46.0 % 35.0Low   36.3 R 38.6 R 36.8 R 36.8 R 36.8 R 36.7 R  MCV 80.0 - 100.0 fL 107.4High   110.8High   109.7High   105.9High   108.7High   109.1High   110.5High    MCH 26.0 - 34.0 pg 36.5High   38.3High   37.1High   36.8High   37.4High   37.4High   37.9High    MCHC 30.0 - 36.0 g/dL 34.0  34.6 R 33.8 R 34.8 R 34.4 R 34.2 R 34.3 R  RDW 11.5 - 15.5 % 14.6  14.5 R 14.6High  R 14.7High  R 14.1 R 13.8 R 14.9High  R  Platelets 150 - 400 K/uL 572High   531High  R 545High  R 497High  R 322 R 323 R 341 R  nRBC 0.0 - 0.2 % 0.0         Neutrophils Relative % % 50  45  56  42  36  35  36   Neutro Abs 1.7 - 7.7 K/uL 3.1  3.0 R 3.5 R 2.7 R 1.9 R 1.7 R 2.0 R  Lymphocytes Relative % 39  46  35  48  53  52  53   Lymphs Abs 0.7 - 4.0 K/uL 2.4  3.1 R 2.2 R 3.0 R 2.7 R 2.5 R 2.9 R  Monocytes Relative % 9  7  8  8  9  11  9    Monocytes Absolute 0.1 - 1.0 K/uL 0.5  0.4 R 0.5 R 0.5 R 0.5 R 0.5 R 0.5 R  Eosinophils Relative % 2  2  1  2  2  2  2    Eosinophils Absolute 0.0 - 0.5 K/uL 0.2  0.1 R 0.0 R 0.1 R 0.1 R 0.1 R 0.1 R  Basophils Relative % 0  0  0  0  0  0  0   Basophils Absolute 0.0 - 0.1 K/uL 0.0  0.0 R, CM 0.0 R, CM 0.0 R 0.0 R 0.0 R 0.0 R  Immature Granulocytes % 0         Abs Immature Granulocytes 0.00 - 0.07 K/uL 0.02         Comment: Performed at [facility], [address] [place], [place] [address]  Resulting Agency  [facility] CLIN LAB [place] [facility] [place] [facility] [place] [facility] [place] [facility] [place] [facility] [place] [facility]      Specimen Collected: 03/10/18 13:35  Last Resulted: 03/10/18 13:47

## 2018-06-16 NOTE — Telephone Encounter (Signed)
Daughter called reporting that patient has lost her pills and it is not due for refill until 6/5. Asking if we can help her get enough medicine until it is due for refill again.  This was refill today

## 2018-08-24 ENCOUNTER — Inpatient Hospital Stay
Admission: EM | Admit: 2018-08-24 | Discharge: 2018-08-26 | DRG: 066 | Disposition: A | Discharge status: Skilled Nursing Facility | Payer: Medicare Other | Attending: Specialist | Admitting: Specialist

## 2018-08-24 ENCOUNTER — Emergency Department: Payer: Medicare Other

## 2018-08-24 ENCOUNTER — Encounter: Payer: Self-pay | Admitting: Emergency Medicine

## 2018-08-24 ENCOUNTER — Other Ambulatory Visit: Payer: Self-pay

## 2018-08-24 DIAGNOSIS — R29702 NIHSS score 2: Secondary | ICD-10-CM | POA: Diagnosis present

## 2018-08-24 DIAGNOSIS — Z66 Do not resuscitate: Secondary | ICD-10-CM | POA: Diagnosis present

## 2018-08-24 DIAGNOSIS — Z79899 Other long term (current) drug therapy: Secondary | ICD-10-CM | POA: Diagnosis not present

## 2018-08-24 DIAGNOSIS — Z79890 Hormone replacement therapy (postmenopausal): Secondary | ICD-10-CM

## 2018-08-24 DIAGNOSIS — R29810 Facial weakness: Secondary | ICD-10-CM | POA: Diagnosis present

## 2018-08-24 DIAGNOSIS — I10 Essential (primary) hypertension: Secondary | ICD-10-CM | POA: Diagnosis present

## 2018-08-24 DIAGNOSIS — R4701 Aphasia: Secondary | ICD-10-CM | POA: Diagnosis present

## 2018-08-24 DIAGNOSIS — I639 Cerebral infarction, unspecified: Secondary | ICD-10-CM | POA: Diagnosis present

## 2018-08-24 DIAGNOSIS — E7800 Pure hypercholesterolemia, unspecified: Secondary | ICD-10-CM | POA: Diagnosis present

## 2018-08-24 DIAGNOSIS — E785 Hyperlipidemia, unspecified: Secondary | ICD-10-CM | POA: Diagnosis present

## 2018-08-24 DIAGNOSIS — D473 Essential (hemorrhagic) thrombocythemia: Secondary | ICD-10-CM | POA: Diagnosis present

## 2018-08-24 DIAGNOSIS — Z853 Personal history of malignant neoplasm of breast: Secondary | ICD-10-CM

## 2018-08-24 DIAGNOSIS — Z90710 Acquired absence of both cervix and uterus: Secondary | ICD-10-CM

## 2018-08-24 DIAGNOSIS — Z20828 Contact with and (suspected) exposure to other viral communicable diseases: Secondary | ICD-10-CM | POA: Diagnosis present

## 2018-08-24 DIAGNOSIS — E039 Hypothyroidism, unspecified: Secondary | ICD-10-CM | POA: Diagnosis present

## 2018-08-24 DIAGNOSIS — I63511 Cerebral infarction due to unspecified occlusion or stenosis of right middle cerebral artery: Principal | ICD-10-CM | POA: Diagnosis present

## 2018-08-24 HISTORY — DX: Essential (hemorrhagic) thrombocythemia: D47.3

## 2018-08-24 LAB — DIFFERENTIAL
Abs Immature Granulocytes: 0.05 10*3/uL (ref 0.00–0.07)
Basophils Absolute: 0 10*3/uL (ref 0.0–0.1)
Basophils Relative: 0 %
Eosinophils Absolute: 0 10*3/uL (ref 0.0–0.5)
Eosinophils Relative: 0 %
Immature Granulocytes: 1 %
Lymphocytes Relative: 26 %
Lymphs Abs: 2.3 10*3/uL (ref 0.7–4.0)
Monocytes Absolute: 0.7 10*3/uL (ref 0.1–1.0)
Monocytes Relative: 7 %
Neutro Abs: 5.9 10*3/uL (ref 1.7–7.7)
Neutrophils Relative %: 66 %

## 2018-08-24 LAB — COMPREHENSIVE METABOLIC PANEL
ALT: 13 U/L (ref 0–44)
AST: 22 U/L (ref 15–41)
Albumin: 4.3 g/dL (ref 3.5–5.0)
Alkaline Phosphatase: 75 U/L (ref 38–126)
Anion gap: 9 (ref 5–15)
BUN: 27 mg/dL — ABNORMAL HIGH (ref 8–23)
CO2: 25 mmol/L (ref 22–32)
Calcium: 9 mg/dL (ref 8.9–10.3)
Chloride: 101 mmol/L (ref 98–111)
Creatinine, Ser: 1.2 mg/dL — ABNORMAL HIGH (ref 0.44–1.00)
GFR calc Af Amer: 44 mL/min — ABNORMAL LOW (ref >60)
GFR calc non Af Amer: 38 mL/min — ABNORMAL LOW (ref >60)
Glucose, Bld: 130 mg/dL — ABNORMAL HIGH (ref 70–99)
Potassium: 3.9 mmol/L (ref 3.5–5.1)
Sodium: 135 mmol/L (ref 135–145)
Total Bilirubin: 0.9 mg/dL (ref 0.3–1.2)
Total Protein: 6.9 g/dL (ref 6.5–8.1)

## 2018-08-24 LAB — CBC
HCT: 35.2 % — ABNORMAL LOW (ref 36.0–46.0)
Hemoglobin: 12.4 g/dL (ref 12.0–15.0)
MCH: 36.7 pg — ABNORMAL HIGH (ref 26.0–34.0)
MCHC: 35.2 g/dL (ref 30.0–36.0)
MCV: 104.1 fL — ABNORMAL HIGH (ref 80.0–100.0)
Platelets: 451 10*3/uL — ABNORMAL HIGH (ref 150–400)
RBC: 3.38 MIL/uL — ABNORMAL LOW (ref 3.87–5.11)
RDW: 14.8 % (ref 11.5–15.5)
WBC: 9 10*3/uL (ref 4.0–10.5)
nRBC: 0 % (ref 0.0–0.2)

## 2018-08-24 LAB — APTT: aPTT: 30 seconds (ref 24–36)

## 2018-08-24 LAB — PROTIME-INR
INR: 1 (ref 0.8–1.2)
Prothrombin Time: 13.2 seconds (ref 11.4–15.2)

## 2018-08-24 LAB — SARS CORONAVIRUS 2 BY RT PCR (HOSPITAL ORDER, PERFORMED IN ~~LOC~~ HOSPITAL LAB): SARS Coronavirus 2: NEGATIVE

## 2018-08-24 LAB — ETHANOL: Alcohol, Ethyl (B): <10 mg/dL (ref <10)

## 2018-08-24 MED ORDER — ASPIRIN 81 MG PO CHEW
324.0000 mg | CHEWABLE_TABLET | Freq: Once | ORAL | Status: AC
  Administered 2018-08-24: 23:00:00 324 mg via ORAL
  Filled 2018-08-24: qty 4

## 2018-08-24 NOTE — ED Provider Notes (Signed)
[facility] Emergency Department Provider Note   ____________________________________________    I have reviewed the triage vital signs and the nursing notes.   HISTORY  Chief Complaint Stroke Symptoms     HPI Rachel Durham is a 85 y.o. female who presents with left facial droop and slurred speech.  Daughter is providing history and reports the patient was last seen well at 9 PM last night.  Slept later than usual today and when she got out of bed husband noted that she had a facial droop and was drooling while trying to eat her cereal.  Daughter notes her voice has been slurred as well.  No history of CVA.  Past Medical History:  Diagnosis Date  . Breast cancer ([place])   . Hyperlipidemia   . Hypertension   . Hypothyroidism     Patient Active Problem List   Diagnosis Date Noted  . Essential thrombocytosis ([place]) 01/04/2016    Past Surgical History:  Procedure Laterality Date  . ABDOMINAL HYSTERECTOMY    . MASTECTOMY      Prior to Admission medications   Medication Sig Start Date End Date Taking? Authorizing Provider  aspirin 81 MG tablet Take 81 mg by mouth daily.    [provider]  calcium carbonate (CALCIUM 600) 600 MG TABS tablet Take by mouth.    [provider]  Cholecalciferol (VITAMIN D3) 1000 UNITS CAPS Take 1 capsule by mouth daily.    [provider]  Flaxseed, Linseed, (FLAX SEED OIL PO) Take 1 capsule by mouth.    [provider]  GARLIC OIL PO Take 1 capsule by mouth daily.    [provider]  glucosamine-chondroitin 500-400 MG tablet Take 1 tablet by mouth 3 (three) times daily.    [provider]  hydroxyurea (HYDREA) 500 MG capsule TAKE 1 CAPSULE (500 MG TOTAL) BY MOUTH DAILY. 06/16/18   [name] Huger, MD  Lactobacillus (ACIDOPHILUS) 100 MG CAPS Take 2 capsules by mouth daily.    [provider]  levothyroxine (SYNTHROID, LEVOTHROID) 75 MCG tablet  06/29/14    [provider]  lisinopril-hydrochlorothiazide (PRINZIDE,ZESTORETIC) 20-12.5 MG per tablet  06/24/14   [provider]  Multiple Vitamin (MULTIVITAMIN) capsule Take 1 capsule by mouth daily.    [provider]  Multiple Vitamins-Minerals (MULTIVITAMIN ADULTS PO) Take by mouth.    [provider]  Omega-3 Fatty Acids (FISH OIL) 1200 MG CAPS Take 1 capsule by mouth daily.    [provider]  propranolol (INDERAL) 10 MG tablet  06/24/14   [provider]     Allergies Fosamax [alendronate sodium] and Raloxifene  History reviewed. No pertinent family history.  Social History Social History   Tobacco Use  . Smoking status: Never Smoker  . Smokeless tobacco: Never Used  Substance Use Topics  . Alcohol use: No  . Drug use: No    Review of Systems  Constitutional: No fever/chills Eyes: No visual changes.  ENT: No sore throat. Cardiovascular: Denies chest pain. Respiratory: Denies shortness of breath. Gastrointestinal: No abdominal pain.  .   Genitourinary: Negative for dysuria. Musculoskeletal: Negative for back pain. Skin: Negative for rash. Neurological: As above   ____________________________________________   PHYSICAL EXAM:  VITAL SIGNS: ED Triage Vitals  Enc Vitals Group     BP 08/24/18 2115 (!) 146/65     Pulse Rate 08/24/18 2115 92     Resp 08/24/18 2115 20     Temp 08/24/18 2115 99.1  F (37.3 C)     Temp src --      SpO2 08/24/18 2115 97 %     Weight 08/24/18 2119 61.2 kg (135 lb)     Height 08/24/18 2119 1.524 m (5')     Head Circumference --      Peak Flow --      Pain Score 08/24/18 2119 0     Pain Loc --      Pain Edu? --      Excl. in [place]? --     Constitutional: Alert Eyes: Conjunctivae are normal.  PERRLA, EOMI  Nose: No congestion/rhinnorhea. Mouth/Throat: Mucous membranes are moist.  Left facial droop forehead spared Neck:  Painless ROM Cardiovascular: Normal rate, regular rhythm.  Good  peripheral circulation. Respiratory: Normal respiratory effort.  No retractions. Lungs CTAB. Gastrointestinal: Soft and nontender. No distention.  No CVA tenderness.  Musculoskeletal:   Warm and well perfused, normal strength in the extremities Neurologic:  Normal speech and language. No gross focal neurologic deficits are appreciated.  Left facial droop as noted above, otherwise reassuring neuro exam Skin:  Skin is warm, dry and intact. No rash noted. Psychiatric: Mood and affect are normal. Speech and behavior are normal.  ____________________________________________   LABS (all labs ordered are listed, but only abnormal results are displayed)  Labs Reviewed  CBC - Abnormal; Notable for the following components:      Result Value   RBC 3.38 (*)    HCT 35.2 (*)    MCV 104.1 (*)    MCH 36.7 (*)    Platelets 451 (*)    All other components within normal limits  COMPREHENSIVE METABOLIC PANEL - Abnormal; Notable for the following components:   Glucose, Bld 130 (*)    BUN 27 (*)    Creatinine, Ser 1.20 (*)    GFR calc non Af Amer 38 (*)    GFR calc Af Amer 44 (*)    All other components within normal limits  SARS CORONAVIRUS 2 (HOSPITAL ORDER, [place] LAB)  ETHANOL  PROTIME-INR  APTT  DIFFERENTIAL  URINE DRUG SCREEN, QUALITATIVE ([facility] ONLY)  URINALYSIS, ROUTINE W REFLEX MICROSCOPIC   ____________________________________________  EKG  ED ECG REPORT I, Lavonia Drafts, the attending physician, personally viewed and interpreted this ECG.  Date: 08/24/2018  Rhythm: normal sinus rhythm QRS Axis: normal Intervals: normal ST/T Wave abnormalities: normal Narrative Interpretation: no evidence of acute ischemia  ____________________________________________  RADIOLOGY  CT head ____________________________________________   PROCEDURES  Procedure(s) performed: No  Procedures   Critical Care performed: No  ____________________________________________   INITIAL IMPRESSION / ASSESSMENT AND PLAN / ED COURSE  Pertinent labs & imaging results that were available during my care of the patient were reviewed by me and considered in my medical decision making (see chart for details).  Patient presents with strokelike symptoms.  NIH stroke scale of 2.  Last known normal greater than 24 hours ago, not a TPA candidate.  Pending CT lab work   Lab work is overall unremarkable, CT head no evidence of bleed, have discussed with Dr Marcille Blanco of the hospitalist service.    ____________________________________________   FINAL CLINICAL IMPRESSION(S) / ED DIAGNOSES  Final diagnoses:  Cerebrovascular accident (CVA), unspecified mechanism ([place])        Note:  This document was prepared using Dragon voice recognition software and may include unintentional dictation errors.   Lavonia Drafts, MD 08/24/18 2259

## 2018-08-24 NOTE — H&P (Signed)
Rachel Durham is an 85 y.o. female.   Chief Complaint: Drooling HPI: The patient with past medical history of essential thrombocytosis, hyperlipidemia, hypertension, hypothyroidism and history of breast cancer presents to the emergency department due to facial drooping.  The patient's daughter noted that her mom was having difficulty drinking approximately 12 hours prior to admission.  She denies weakness, numbness or tingling in her extremities.  However, her daughter feels as if her mom speech is somewhat changed.  CT of her head shows right frontal ischemic stroke.  The patient's blood pressure was within acceptable limits upon arrival.  Thus, the emergency department staff called the hospitalist service for further evaluation.  Past Medical History:  Diagnosis Date  . Breast cancer ([place])   . Essential thrombocytosis ([place])   . Hyperlipidemia   . Hypertension   . Hypothyroidism     Past Surgical History:  Procedure Laterality Date  . ABDOMINAL HYSTERECTOMY    . MASTECTOMY      History reviewed. No pertinent family history.  No history of strokes in immediate family, per patient  Social History:  reports that she has never smoked. She has never used smokeless tobacco. She reports that she does not drink alcohol or use drugs.  Allergies:  Allergies  Allergen Reactions  . Fosamax [Alendronate Sodium] Other (See Comments)    Reaction: unknown  . Raloxifene Other (See Comments)    Reaction: unknown    Medications Prior to Admission  Medication Sig Dispense Refill  . hydroxyurea (HYDREA) 500 MG capsule TAKE 1 CAPSULE (500 MG TOTAL) BY MOUTH DAILY. 90 capsule 3  . levothyroxine (SYNTHROID) 88 MCG tablet Take 88 mcg by mouth daily.     Marland Kitchen lisinopril-hydrochlorothiazide (PRINZIDE,ZESTORETIC) 20-12.5 MG per tablet Take 1 tablet by mouth daily.       Results for orders placed or performed during the hospital encounter of 08/24/18 (from the past 48 hour(s))  Ethanol     Status: None    Collection Time: 08/24/18  9:24 PM  Result Value Ref Range   Alcohol, Ethyl (B) <10 <10 mg/dL    Comment: (NOTE) Lowest detectable limit for serum alcohol is 10 mg/dL. For medical purposes only. Performed at [facility], [address] [place], [place] [address]   Protime-INR     Status: None   Collection Time: 08/24/18  9:24 PM  Result Value Ref Range   Prothrombin Time 13.2 11.4 - 15.2 seconds   INR 1.0 0.8 - 1.2    Comment: (NOTE) INR goal varies based on device and disease states. Performed at [facility], [address] [place], [place] [address]   APTT     Status: None   Collection Time: 08/24/18  9:24 PM  Result Value Ref Range   aPTT 30 24 - 36 seconds    Comment: Performed at [facility], [address] [place], [place] [address]  CBC     Status: Abnormal   Collection Time: 08/24/18  9:24 PM  Result Value Ref Range   WBC 9.0 4.0 - 10.5 K/uL   RBC 3.38 (L) 3.87 - 5.11 MIL/uL   Hemoglobin 12.4 12.0 - 15.0 g/dL   HCT 35.2 (L) 36.0 - 46.0 %   MCV 104.1 (H) 80.0 - 100.0 fL   MCH 36.7 (H) 26.0 - 34.0 pg   MCHC 35.2 30.0 - 36.0 g/dL   RDW 14.8 11.5 - 15.5 %   Platelets 451 (H) 150 - 400 K/uL   nRBC 0.0  0.0 - 0.2 %    Comment: Performed at [facility], [address] [place], [place] [address]  Differential     Status: None   Collection Time: 08/24/18  9:24 PM  Result Value Ref Range   Neutrophils Relative % 66 %   Neutro Abs 5.9 1.7 - 7.7 K/uL   Lymphocytes Relative 26 %   Lymphs Abs 2.3 0.7 - 4.0 K/uL   Monocytes Relative 7 %   Monocytes Absolute 0.7 0.1 - 1.0 K/uL   Eosinophils Relative 0 %   Eosinophils Absolute 0.0 0.0 - 0.5 K/uL   Basophils Relative 0 %   Basophils Absolute 0.0 0.0 - 0.1 K/uL   Immature Granulocytes 1 %   Abs Immature Granulocytes 0.05 0.00 - 0.07 K/uL    Comment: Performed at [facility], [address] [place], [place] [address]  Comprehensive metabolic panel     Status:  Abnormal   Collection Time: 08/24/18  9:24 PM  Result Value Ref Range   Sodium 135 135 - 145 mmol/L   Potassium 3.9 3.5 - 5.1 mmol/L   Chloride 101 98 - 111 mmol/L   CO2 25 22 - 32 mmol/L   Glucose, Bld 130 (H) 70 - 99 mg/dL   BUN 27 (H) 8 - 23 mg/dL   Creatinine, Ser 1.20 (H) 0.44 - 1.00 mg/dL   Calcium 9.0 8.9 - 10.3 mg/dL   Total Protein 6.9 6.5 - 8.1 g/dL   Albumin 4.3 3.5 - 5.0 g/dL   AST 22 15 - 41 U/L   ALT 13 0 - 44 U/L   Alkaline Phosphatase 75 38 - 126 U/L   Total Bilirubin 0.9 0.3 - 1.2 mg/dL   GFR calc non Af Amer 38 (L) >60 mL/min   GFR calc Af Amer 44 (L) >60 mL/min   Anion gap 9 5 - 15    Comment: Performed at [facility], [address], [place], [place] [address]   Ct Head Wo Contrast  Result Date: 08/24/2018 CLINICAL DATA:  Initial evaluation for acute weakness. EXAM: CT HEAD WITHOUT CONTRAST TECHNIQUE: Contiguous axial images were obtained from the base of the skull through the vertex without intravenous contrast. COMPARISON:  None available. FINDINGS: Brain: Moderately advanced age-related cerebral atrophy with chronic small vessel ischemic disease. Evolving cytotoxic edema seen involving the anterior right insula, with extension into the overlying right frontal operculum, compatible with evolving acute ischemic right MCA territory infarct. No associated hemorrhage or significant mass effect. No other acute large vessel territory infarct. No intracranial hemorrhage. No mass lesion, midline shift, or significant mass effect. No hydrocephalus. No extra-axial fluid collection. Vascular: No hyperdense vessel. Scattered vascular calcifications noted within the carotid siphons. Skull: Scalp soft tissues and calvarium within normal limits. Sinuses/Orbits: Globes and orbital soft tissues within normal limits. Paranasal sinuses are largely clear. No mastoid effusion. Other: None. IMPRESSION: 1. Evolving acute ischemic right MCA territory infarct involving the anterior  right insula and overlying right frontal lobe. No associated hemorrhage or significant mass effect. 2. Underlying moderately advanced age-related cerebral atrophy with chronic microvascular ischemic disease. Electronically Signed   By: Jeannine Boga M.D.   On: 08/24/2018 23:05    Review of Systems  Constitutional: Negative for chills and fever.  HENT: Negative for sore throat and tinnitus.   Eyes: Negative for blurred vision and redness.  Respiratory: Negative for cough and shortness of breath.   Cardiovascular: Negative for chest pain, palpitations, orthopnea and PND.  Gastrointestinal: Negative  for abdominal pain, diarrhea, nausea and vomiting.  Genitourinary: Negative for dysuria, frequency and urgency.  Musculoskeletal: Negative for joint pain and myalgias.  Skin: Negative for rash.       No lesions  Neurological: Positive for speech change. Negative for focal weakness and weakness.  Endo/Heme/Allergies: Does not bruise/bleed easily.       No temperature intolerance  Psychiatric/Behavioral: Negative for depression and suicidal ideas.    Blood pressure 128/68, pulse 92, temperature 99.1 F (37.3 C), resp. rate (!) 21, height 5' (1.524 m), weight 61.2 kg, SpO2 97 %. Physical Exam  Vitals reviewed. Constitutional: She is oriented to person, place, and time. She appears well-developed and well-nourished. No distress.  HENT:  Head: Normocephalic and atraumatic.  Mouth/Throat: Oropharynx is clear and moist.  Eyes: Pupils are equal, round, and reactive to light. Conjunctivae and EOM are normal. No scleral icterus.  Neck: Normal range of motion. Neck supple. No JVD present. No tracheal deviation present. No thyromegaly present.  Cardiovascular: Normal rate, regular rhythm and normal heart sounds. Exam reveals no gallop and no friction rub.  No murmur heard. Respiratory: Effort normal and breath sounds normal.  GI: Soft. Bowel sounds are normal. She exhibits no distension. There is  no abdominal tenderness.  Genitourinary:    Genitourinary Comments: Deferred   Musculoskeletal: Normal range of motion.        General: No edema.  Lymphadenopathy:    She has no cervical adenopathy.  Neurological: She is alert and oriented to person, place, and time. No cranial nerve deficit. She exhibits normal muscle tone.  Skin: Skin is warm and dry.  Psychiatric: She has a normal mood and affect. Her behavior is normal. Judgment and thought content normal.     Assessment/Plan This is a 85 year old female admitted for stroke. 1.  Stroke: Ischemic stroke in MCA territory affecting insula.  Facial droop is resolving but the patient does have some word finding difficulty and mild dysarthria.  Schedule MRI.  Obtain carotid ultrasounds as well as echocardiogram.  Consult neurology. 2.  Essential thrombocytosis: May have contributed to current stroke.  Although it appears the patient is taking her hydroxyurea as MCV is elevated.  Current platelet count 451. 3.  Hypertension: Permissive hypertension until tomorrow morning at which time she may resume lisinopril and hydrochlorothiazide 4.  Hypothyroidism: Check TSH; continue Synthroid 5.  DVT prophylaxis: Heparin 6.  GI prophylaxis: None The patient is a DNR.  Time spent on admission orders and patient care approximately 45 minutes  Harrie Foreman, MD 08/24/2018, 11:30 PM

## 2018-08-24 NOTE — ED Triage Notes (Signed)
Pt's family called ems out for bp check - when talking to the family they stated she has had weakness and leaning to the left with mouth drooping mouth earlier today. Last seen well last night at 9pm

## 2018-08-25 ENCOUNTER — Inpatient Hospital Stay: Payer: Medicare Other

## 2018-08-25 ENCOUNTER — Other Ambulatory Visit: Payer: Self-pay

## 2018-08-25 ENCOUNTER — Inpatient Hospital Stay
Admit: 2018-08-25 | Discharge: 2018-08-25 | Disposition: A | Discharge status: Home / Self Care | Payer: Medicare Other | Attending: Internal Medicine | Admitting: Internal Medicine

## 2018-08-25 DIAGNOSIS — I639 Cerebral infarction, unspecified: Secondary | ICD-10-CM

## 2018-08-25 LAB — URINE DRUG SCREEN, QUALITATIVE (ARMC ONLY)
Amphetamines, Ur Screen: NOT DETECTED
Barbiturates, Ur Screen: NOT DETECTED
Benzodiazepine, Ur Scrn: NOT DETECTED
Cannabinoid 50 Ng, Ur ~~LOC~~: NOT DETECTED
Cocaine Metabolite,Ur ~~LOC~~: NOT DETECTED
MDMA (Ecstasy)Ur Screen: NOT DETECTED
Methadone Scn, Ur: NOT DETECTED
Opiate, Ur Screen: NOT DETECTED
Phencyclidine (PCP) Ur S: NOT DETECTED
Tricyclic, Ur Screen: NOT DETECTED

## 2018-08-25 LAB — URINALYSIS, ROUTINE W REFLEX MICROSCOPIC
Bilirubin Urine: NEGATIVE
Glucose, UA: NEGATIVE mg/dL
Hgb urine dipstick: NEGATIVE
Ketones, ur: NEGATIVE mg/dL
Nitrite: POSITIVE — AB
Protein, ur: NEGATIVE mg/dL
Specific Gravity, Urine: 1.013 (ref 1.005–1.030)
WBC, UA: >50 WBC/hpf — ABNORMAL HIGH (ref 0–5)
pH: 6 (ref 5.0–8.0)

## 2018-08-25 LAB — LIPID PANEL
Cholesterol: 174 mg/dL (ref 0–200)
HDL: 43 mg/dL (ref >40)
LDL Cholesterol: 114 mg/dL — ABNORMAL HIGH (ref 0–99)
Total CHOL/HDL Ratio: 4 RATIO
Triglycerides: 83 mg/dL (ref <150)
VLDL: 17 mg/dL (ref 0–40)

## 2018-08-25 MED ORDER — ACETAMINOPHEN 650 MG RE SUPP: 650.0000 mg | RECTAL | Status: DC | PRN

## 2018-08-25 MED ORDER — LISINOPRIL-HYDROCHLOROTHIAZIDE 20-12.5 MG PO TABS: 1.0000 | ORAL_TABLET | Freq: Every day | ORAL | Status: DC

## 2018-08-25 MED ORDER — ACETAMINOPHEN 160 MG/5ML PO SOLN
650.0000 mg | ORAL | Status: DC | PRN
  Filled 2018-08-25: qty 20.3

## 2018-08-25 MED ORDER — ASPIRIN EC 325 MG PO TBEC
325.0000 mg | DELAYED_RELEASE_TABLET | Freq: Every day | ORAL | Status: DC
  Administered 2018-08-26: 325 mg via ORAL
  Filled 2018-08-25: qty 1

## 2018-08-25 MED ORDER — SODIUM CHLORIDE 0.9 % IV SOLN
INTRAVENOUS | Status: DC
  Administered 2018-08-25: 02:00:00 via INTRAVENOUS

## 2018-08-25 MED ORDER — HYDROXYUREA 500 MG PO CAPS
500.0000 mg | ORAL_CAPSULE | Freq: Every day | ORAL | Status: DC
  Administered 2018-08-25 – 2018-08-26 (×2): 500 mg via ORAL
  Filled 2018-08-25 (×2): qty 1

## 2018-08-25 MED ORDER — STROKE: EARLY STAGES OF RECOVERY BOOK
Freq: Once | Status: AC
  Administered 2018-08-25: 02:00:00

## 2018-08-25 MED ORDER — ATORVASTATIN CALCIUM 20 MG PO TABS
40.0000 mg | ORAL_TABLET | Freq: Every day | ORAL | Status: DC
  Administered 2018-08-25: 40 mg via ORAL
  Filled 2018-08-25: qty 2

## 2018-08-25 MED ORDER — HEPARIN SODIUM (PORCINE) 5000 UNIT/ML IJ SOLN
5000.0000 [IU] | Freq: Three times a day (TID) | INTRAMUSCULAR | Status: DC
  Administered 2018-08-25 (×2): 5000 [IU] via SUBCUTANEOUS
  Filled 2018-08-25 (×3): qty 1

## 2018-08-25 MED ORDER — ACETAMINOPHEN 325 MG PO TABS: 650.0000 mg | ORAL_TABLET | ORAL | Status: DC | PRN

## 2018-08-25 MED ORDER — SENNOSIDES-DOCUSATE SODIUM 8.6-50 MG PO TABS
1.0000 | ORAL_TABLET | Freq: Every evening | ORAL | Status: DC | PRN
  Filled 2018-08-25: qty 1

## 2018-08-25 MED ORDER — LISINOPRIL 20 MG PO TABS
20.0000 mg | ORAL_TABLET | Freq: Every day | ORAL | Status: DC
  Administered 2018-08-25 – 2018-08-26 (×2): 20 mg via ORAL
  Filled 2018-08-25 (×2): qty 1

## 2018-08-25 MED ORDER — SODIUM CHLORIDE 0.9 % IV SOLN
1.0000 g | INTRAVENOUS | Status: DC
  Administered 2018-08-25: 16:00:00 1 g via INTRAVENOUS
  Filled 2018-08-25: qty 10
  Filled 2018-08-25: qty 1

## 2018-08-25 MED ORDER — LEVOTHYROXINE SODIUM 88 MCG PO TABS
88.0000 ug | ORAL_TABLET | Freq: Every day | ORAL | Status: DC
  Administered 2018-08-25 – 2018-08-26 (×2): 88 ug via ORAL
  Filled 2018-08-25 (×2): qty 1

## 2018-08-25 MED ORDER — HYDROCHLOROTHIAZIDE 12.5 MG PO CAPS
12.5000 mg | ORAL_CAPSULE | Freq: Every day | ORAL | Status: DC
  Administered 2018-08-25 – 2018-08-26 (×2): 12.5 mg via ORAL
  Filled 2018-08-25 (×2): qty 1

## 2018-08-25 NOTE — Evaluation (Signed)
Physical Therapy Evaluation Patient Details Name: Rachel Durham MRN: [id number] DOB: 26-Jun-1920 Today's Date: 08/25/2018   History of Present Illness  85 y.o. female admitted on 07/26 with L facial drooping and slurred speech with PMH of thrombocytosis, hyperlipidemia, hypertension, hypothyroidism and history of breast cancer. CT shows: right frontal ischemic stroke.  Clinical Impression  Prior to hospital admission, pt was independent.  Pt lives with her husband in a one level house and no steps to entry.  Currently pt is mod assist throughout the session. Pt has difficulties communicating through verbal and writing. Daughter at bed side and able to provide pt's information. Heavy tactile cueing and hand guidance provided through out the session. B LE lift from supine to sit on EOB. Hand held cueing provided for RW management and reaching to a steady surface (bed/arm rest) for a safe transfer. Difficulties with MMT due to difficulties with communication (see details below). LE light touch sensation was intact. Pt would benefit from skilled PT to address noted impairments and functional limitations (see below for any additional details).  Upon hospital discharge, recommend pt discharge to SNF to improve strength, muscle endurance and gait for ADLs and functional mobility.    Follow Up Recommendations SNF    Equipment Recommendations  Rolling walker with 5" wheels;3in1 (PT)    Recommendations for Other Services OT consult     Precautions / Restrictions Precautions Precautions: Fall Restrictions Weight Bearing Restrictions: No      Mobility  Bed Mobility Overal bed mobility: Needs Assistance Bed Mobility: Supine to Sit     Supine to sit: Mod assist     General bed mobility comments: Pt presents confusion when given instructions to sit on EOB.  Transfers Overall transfer level: Needs assistance Equipment used: Rolling walker (2 wheeled) Transfers: Sit to/from Stand Sit to  Stand: Mod assist         General transfer comment: Pt requires tactile cueing for hand positioning and RW managements; pt unable to maintain balance when standing up from the bed and attemps to push RW forward. Tactile cues provided for hand placing from RW to arm rest.  Ambulation/Gait Ambulation/Gait assistance: Mod assist Gait Distance (Feet): 3 Feet Assistive device: Rolling walker (2 wheeled) Gait Pattern/deviations: Decreased stance time - left;Decreased stance time - right;Decreased step length - left;Decreased step length - right Gait velocity: decreased   General Gait Details: Tactile cueing provided for moving RW while transfer to [facility].  Stairs            Wheelchair Mobility    Modified Rankin (Stroke Patients Only)       Balance Overall balance assessment: Needs assistance Sitting-balance support: No upper extremity supported;Feet supported Sitting balance-Leahy Scale: Good Sitting balance - Comments: steady sitting and reaching within BOS   Standing balance support: Bilateral upper extremity supported Standing balance-Leahy Scale: Poor Standing balance comment: Unable to maintain balance with BUE support                             Pertinent Vitals/Pain Pain Assessment: No/denies pain    Home Living Family/patient expects to be discharged to:: Private residence Living Arrangements: Spouse/significant other Available Help at Discharge: Available 24 hours/day;Family Type of Home: House Home Access: Level entry     Home Layout: Two level Home Equipment: Walker - 2 wheels;Cane - quad Additional Comments: Per pt's daughter, pt doesn't like to use AD.    Prior Function Level of Independence: Independent  with assistive device(s)         Comments: Ambulate inside of the house with holding on furnitures for balance     Hand Dominance   Dominant Hand: Right    Extremity/Trunk Assessment   Upper Extremity Assessment Upper Extremity  Assessment: Difficult to assess due to impaired cognition(at least 2/5 on BUE shouder flexion, 3/5 elbow flexion,and extension.)    Lower Extremity Assessment Lower Extremity Assessment: Difficult to assess due to impaired cognition(At least 3/5 with B hip flexion, knee flexion/extension, and dorsiflexion/ plantarflexion. Pain at L foot when applying pressure over.)       Communication   Communication: HOH(Daughter at bed side and provided info for home situation)  Cognition Arousal/Alertness: Awake/alert Behavior During Therapy: WFL for tasks assessed/performed Overall Cognitive Status: Impaired/Different from baseline Area of Impairment: Following commands                       Following Commands: Follows one step commands inconsistently       General Comments: Pt is HOH and unable to communicate through writing either. Cannot consistively follow command.      General Comments      Exercises Total Joint Exercises Ankle Circles/Pumps: AROM;Strengthening;Both;5 reps;Seated General Exercises - Lower Extremity Hip Flexion/Marching: AROM;Strengthening;Both;10 reps;Seated   Assessment/Plan    PT Assessment Patient needs continued PT services  PT Problem List Decreased strength;Decreased range of motion;Decreased activity tolerance;Decreased balance;Decreased mobility;Decreased coordination;Impaired sensation;Decreased knowledge of use of DME       PT Treatment Interventions DME instruction;Gait training;Functional mobility training;Therapeutic activities;Therapeutic exercise;Balance training    PT Goals (Current goals can be found in the Care Plan section)  Acute Rehab PT Goals Patient Stated Goal: to go home PT Goal Formulation: With patient Time For Goal Achievement: 09/08/18 Potential to Achieve Goals: Fair    Frequency 7X/week   Barriers to discharge        Co-evaluation               AM-PAC PT "6 Clicks" Mobility  Outcome Measure Help needed  turning from your back to your side while in a flat bed without using bedrails?: A Lot Help needed moving from lying on your back to sitting on the side of a flat bed without using bedrails?: A Lot Help needed moving to and from a bed to a chair (including a wheelchair)?: A Lot Help needed standing up from a chair using your arms (e.g., wheelchair or bedside chair)?: A Lot Help needed to walk in hospital room?: A Lot Help needed climbing 3-5 steps with a railing? : Total 6 Click Score: 11    End of Session Equipment Utilized During Treatment: Gait belt Activity Tolerance: Patient limited by fatigue Patient left: in chair;with family/visitor present;with call bell/phone within reach;with chair alarm set Nurse Communication: Mobility status;Precautions;Weight bearing status PT Visit Diagnosis: Unsteadiness on feet (R26.81);Other abnormalities of gait and mobility (R26.89);Muscle weakness (generalized) (M62.81);Difficulty in walking, not elsewhere classified (R26.2)    Time: 5852-7782 PT Time Calculation (min) (ACUTE ONLY): 53 min   Charges:              Sherrilyn Rist, SPT 08/25/2018, 2:13 PM

## 2018-08-25 NOTE — Progress Notes (Signed)
*  PRELIMINARY RESULTS* Echocardiogram 2D Echocardiogram has been performed.  Rachel Durham 08/25/2018, 2:41 PM

## 2018-08-25 NOTE — NC FL2 (Signed)
[place] LEVEL OF CARE SCREENING TOOL     IDENTIFICATION  Patient Name: Rachel Durham Birthdate: [birth year] Sex: female Admission Date (Current Location): 08/24/2018  [place] and [place] Number:  Engineering geologist and Address:  [facility], [address] [place], [place] [address]      Provider Number: [id number]  Attending Physician Name and Address:  Henreitta Leber, MD  Relative Name and Phone Number:  Deliah Goody- Daughter [phone]    Current Level of Care: Hospital Recommended Level of Care: [place] Prior Approval Number:    Date Approved/Denied:   PASRR Number: [id number]  Discharge Plan: SNF    Current Diagnoses: Patient Active Problem List   Diagnosis Date Noted  . Stroke (cerebrum) ([place]) 08/24/2018  . Essential thrombocytosis ([place]) 01/04/2016    Orientation RESPIRATION BLADDER Height & Weight     Self  Normal Continent Weight: 54.4 kg Height:  5\' 2"  (157.5 cm)  BEHAVIORAL SYMPTOMS/MOOD NEUROLOGICAL BOWEL NUTRITION STATUS      Continent Diet  AMBULATORY STATUS COMMUNICATION OF NEEDS Skin   Extensive Assist Verbally Normal                       Personal Care Assistance Level of Assistance  Bathing, Feeding, Dressing Bathing Assistance: Limited assistance Feeding assistance: Limited assistance Dressing Assistance: Limited assistance     Functional Limitations Info  Sight Sight Info: Impaired(wears glasses)        SPECIAL CARE FACTORS FREQUENCY  PT (By licensed PT), OT (By licensed OT)     PT Frequency: 5 times per week OT Frequency: 5 times per week            Contractures Contractures Info: Not present    Additional Factors Info  Code Status, Allergies Code Status Info: DNR Allergies Info: Fosamax, raloxifene           Current Medications (08/25/2018):  This is the current hospital active medication list Current Facility-Administered Medications   Medication Dose Route Frequency Provider Last Rate Last Dose  . acetaminophen (TYLENOL) tablet 650 mg  650 mg Oral Q4H PRN Harrie Foreman, MD       Or  . acetaminophen (TYLENOL) solution 650 mg  650 mg Per Tube Q4H PRN Harrie Foreman, MD       Or  . acetaminophen (TYLENOL) suppository 650 mg  650 mg Rectal Q4H PRN Harrie Foreman, MD      . Derrill Memo ON 08/26/2018] aspirin EC tablet 325 mg  325 mg Oral Daily Sainani, Vivek J, MD      . atorvastatin (LIPITOR) tablet 40 mg  40 mg Oral q1800 Henreitta Leber, MD      . cefTRIAXone (ROCEPHIN) 1 g in sodium chloride 0.9 % 100 mL IVPB  1 g Intravenous Q24H Henreitta Leber, MD 200 mL/hr at 08/25/18 1607 1 g at 08/25/18 1607  . heparin injection 5,000 Units  5,000 Units Subcutaneous Q8H Harrie Foreman, MD   5,000 Units at 08/25/18 1437  . lisinopril (ZESTRIL) tablet 20 mg  20 mg Oral Daily Hallaji, Dani Gobble, RPH   20 mg at 08/25/18 1051   And  . hydrochlorothiazide (MICROZIDE) capsule 12.5 mg  12.5 mg Oral Daily Hallaji, Sheema M, RPH   12.5 mg at 08/25/18 1051  . hydroxyurea (HYDREA) capsule 500 mg  500 mg Oral Daily Harrie Foreman, MD   500 mg at 08/25/18 1051  . levothyroxine (  SYNTHROID) tablet 88 mcg  88 mcg Oral Daily Harrie Foreman, MD   88 mcg at 08/25/18 0542  . senna-docusate (Senokot-S) tablet 1 tablet  1 tablet Oral QHS PRN Harrie Foreman, MD         Discharge Medications: Please see discharge summary for a list of discharge medications.  Relevant Imaging Results:  Relevant Lab Results:   Additional Information SS# [id number]  Shelbie Hutching, RN

## 2018-08-25 NOTE — Evaluation (Signed)
Occupational Therapy Evaluation Patient Details Name: Rachel Durham MRN: [id number] DOB: 01/19/1921 Today's Date: 08/25/2018    History of Present Illness 85 y.o. female admitted on 07/26 with L facial drooping and slurred speech with PMH of thrombocytosis, hyperlipidemia, hypertension, hypothyroidism and history of breast cancer. CT shows: right frontal ischemic stroke.   Clinical Impression   Pt seen for OT evaluation this date. Prior to hospital admission, pt was independent with basic ADL, ambulating without AD (pt does not like, prefers to hold onto furniture), and denies falls. Daughter present for session and confirms what pt reports. Pt does receive assist at baseline for medication mgt. Pt lives with her spouse of almost 69 years in a home with level entry. Pt alert and oriented to self and somewhat to her birth date but unaware that she had been told about her diagnosis (dtr confirmed she had been today) and unable to identify the correct date/place. Pt presents with impairments in LUE strength and coordination, impaired balance, and impaired cognition, demonstrating difficulty with sequencing, safety awareness, awareness of deficits, and requiring increased frequency and type of cues to assist alongside physical assist to perform functional toilet transfers from [facility] to recliner, toileting hygiene, and LB dressing tasks. Pt is R hand dominant at baseline. Pt also HOH which may impact cognitive performance. Pt able to read words and sentences from a piece of paper (with 1 pause requiring a verbal cue to continue), identify objects correctly, and required assist to identify safety hazards. Pt is eager to return to PLOF with her spouse. Pt/daughter educated on role of OT and how pt may benefit from additional therapy to support pt's return to [facility] and safe return home. Pt would benefit from skilled OT to address noted impairments and functional limitations (see below for any additional details) in  order to maximize safety and independence while minimizing falls risk and caregiver burden.  Upon hospital discharge, recommend pt discharge to SNF to maximize safety and return to PLOF in order to facilitate safe ultimate return home.    Follow Up Recommendations  SNF    Equipment Recommendations  3 in 1 bedside commode    Recommendations for Other Services       Precautions / Restrictions Precautions Precautions: Fall Restrictions Weight Bearing Restrictions: No      Mobility Bed Mobility     General bed mobility comments: deferred, pt presented seated on BSC and left seated in recliner  Transfers Overall transfer level: Needs assistance Equipment used: 1 person hand held assist Transfers: Sit to/from Stand Sit to Stand: Min assist;Mod assist         General transfer comment: cues for sequencing/safety, hand/foot placement, and to lean forward to correct for posterior lean    Balance Overall balance assessment: Needs assistance Sitting-balance support: No upper extremity supported;Feet supported Sitting balance-Leahy Scale: Good Sitting balance - Comments: steady sitting and reaching within BOS   Standing balance support: Single extremity supported;During functional activity Standing balance-Leahy Scale: Poor Standing balance comment: Unable to maintain balance with BUE support                           ADL either performed or assessed with clinical judgement   ADL Overall ADL's : Needs assistance/impaired Eating/Feeding: Sitting;Set up   Grooming: Sitting;Set up   Upper Body Bathing: Sitting;Minimal assistance   Lower Body Bathing: Sit to/from stand;Minimal assistance   Upper Body Dressing : Minimal assistance;Sitting   Lower Body  Dressing: Sit to/from stand;Minimal assistance   Toilet Transfer: Ambulation;Minimal assistance;Moderate assistance;BSC Toilet Transfer Details (indicate cue type and reason): cues for hand/foot placement and  sequencing, incr time to process, easily distractible by heart monitor Toileting- Clothing Manipulation and Hygiene: Min guard;Sit to/from stand;Minimal assistance;Cueing for sequencing;Cueing for safety Toileting - Clothing Manipulation Details (indicate cue type and reason): cues for hand/foot placement and sequencing, incr time to process, easily distractible by heart monitor             Vision Baseline Vision/History: Wears glasses Wears Glasses: At all times Patient Visual Report: No change from baseline Vision Assessment?: No apparent visual deficits     Perception     Praxis      Pertinent Vitals/Pain Pain Assessment: No/denies pain     Hand Dominance Right   Extremity/Trunk Assessment Upper Extremity Assessment Upper Extremity Assessment: LUE deficits/detail(RUE  at least 4+/5) LUE Deficits / Details: grossly 4-/5, denies sensory deficits, mild coordination deficits vs cognitive processing/sequencing difficulty LUE Sensation: WNL LUE Coordination: decreased fine motor   Lower Extremity Assessment Lower Extremity Assessment: Defer to PT evaluation       Communication Communication Communication: HOH   Cognition Arousal/Alertness: Awake/alert Behavior During Therapy: WFL for tasks assessed/performed Overall Cognitive Status: Impaired/Different from baseline Area of Impairment: Following commands;Orientation;Problem solving;Memory;Awareness;Safety/judgement                 Orientation Level: Disoriented to;Place;Situation     Following Commands: Follows one step commands inconsistently Safety/Judgement: Decreased awareness of safety;Decreased awareness of deficits Awareness: Intellectual Problem Solving: Slow processing;Difficulty sequencing;Requires verbal cues;Requires tactile cues General Comments: increased time to process and respond, pt oriented to self, knows bday w/ cues from dtr, follows simple commands w/ verbal/visual/tactile cues, difficulty  problem solving   General Comments       Exercises Other Exercises Other Exercises: functional transfer/toilet training Other Exercises: situational safety exercise requiring cues to improve upon initial vague response   Shoulder Instructions      Home Living Family/patient expects to be discharged to:: Private residence Living Arrangements: Spouse/significant other Available Help at Discharge: Available 24 hours/day;Family Type of Home: House Home Access: Level entry     Home Layout: Two level         Bathroom Toilet: Standard Bathroom Accessibility: Yes   Home Equipment: Walker - 2 wheels;Cane - quad   Additional Comments: Per pt's daughter, pt doesn't like to use AD.      Prior Functioning/Environment Level of Independence: Needs assistance  Gait / Transfers Assistance Needed: Ambulate inside of the house with holding on furnitures for balance ADL's / Homemaking Assistance Needed: indep with ADL, most often takes sponge baths but denies difficulty getting in and out of the tub   Comments: Daughter assists with medication mgt        OT Problem List: Decreased strength;Decreased cognition;Decreased coordination;Decreased safety awareness;Impaired balance (sitting and/or standing);Decreased knowledge of use of DME or AE;Impaired UE functional use      OT Treatment/Interventions: Self-care/ADL training;Therapeutic exercise;Therapeutic activities;Cognitive remediation/compensation;Neuromuscular education;DME and/or AE instruction;Patient/family education;Balance training    OT Goals(Current goals can be found in the care plan section) Acute Rehab OT Goals Patient Stated Goal: to go home OT Goal Formulation: With patient/family Time For Goal Achievement: 09/08/18 Potential to Achieve Goals: Good ADL Goals Pt Will Perform Lower Body Dressing: with min guard assist;sit to/from stand Pt Will Transfer to Toilet: with min guard assist;ambulating(LRAD for amb) Additional  ADL Goal #1: Pt will independently read through a list  of medications and accurately place pills into pill organizer based on meds list w/ dose requiring supervision and occasional verbal cues  OT Frequency: Min 2X/week   Barriers to D/C:            Co-evaluation              AM-PAC OT "6 Clicks" Daily Activity     Outcome Measure Help from another person eating meals?: A Little Help from another person taking care of personal grooming?: A Little Help from another person toileting, which includes using toliet, bedpan, or urinal?: A Lot Help from another person bathing (including washing, rinsing, drying)?: A Lot Help from another person to put on and taking off regular upper body clothing?: A Little Help from another person to put on and taking off regular lower body clothing?: A Lot 6 Click Score: 15   End of Session Equipment Utilized During Treatment: Gait belt  Activity Tolerance: Patient tolerated treatment well Patient left: in chair;with call bell/phone within reach;with chair alarm set;with family/visitor present;Other (comment)  OT Visit Diagnosis: Other abnormalities of gait and mobility (R26.89);Other symptoms and signs involving cognitive function;Hemiplegia and hemiparesis Hemiplegia - Right/Left: Left Hemiplegia - dominant/non-dominant: Non-Dominant Hemiplegia - caused by: Cerebral infarction                Time: 1354(eval/tx initiated 1354-1409 (15 min) then stopped for ECG)-1526(eval/tx completed 1501-1526 (48min)) OT Time Calculation (min): 92 min Charges:  OT General Charges $OT Visit: 1 Visit OT Evaluation $OT Eval Moderate Complexity: 1 Mod OT Treatments $Self Care/Home Management : 8-22 mins $Therapeutic Activity: 8-22 mins  Jeni Salles, MPH, MS, OTR/L ascom [phone] 08/25/18, 3:52 PM

## 2018-08-25 NOTE — Consult Note (Signed)
Reason for Consult:Dysarthria  Referring Physician: Dr. Verdell Carmine   CC: dysarthria.   HPI: Rachel Durham is an 85 y.o. female with essential thrombocytosis, hyperlipidemia, hypertension, hypothyroidism and history of breast cancer presents to the emergency department due to facial drooping.  The patient's daughter noted that her mom was having difficulty drinking day prior.  She denies weakness, numbness or tingling in her extremities.  However, her daughter feels as if her mom speech is somewhat changed.  CT of her head shows right frontal ischemic stroke.   MRI with R superior division stroke in the R MCA territory.  No anti platelet therapy prior to admission.   Past Medical History:  Diagnosis Date  . Breast cancer ([place])   . Essential thrombocytosis ([place])   . Hyperlipidemia   . Hypertension   . Hypothyroidism     Past Surgical History:  Procedure Laterality Date  . ABDOMINAL HYSTERECTOMY    . MASTECTOMY      History reviewed. No pertinent family history.  Social History:  reports that she has never smoked. She has never used smokeless tobacco. She reports that she does not drink alcohol or use drugs.  Allergies  Allergen Reactions  . Fosamax [Alendronate Sodium] Other (See Comments)    Reaction: unknown  . Raloxifene Other (See Comments)    Reaction: unknown    Medications: I have reviewed the patient's current medications.  ROS: Unable to obtain due to hearing impairment   Physical Examination: Blood pressure (!) 118/56, pulse 75, temperature 98.4 F (36.9 C), temperature source Oral, resp. rate 18, height 5\' 2"  (1.575 m), weight 54.4 kg, SpO2 95 %.   Neurological Examination   Mental Status: Alert to name, dysarthria present  Cranial Nerves: II: Discs flat bilaterally; Visual fields grossly normal, pupils equal, round, reactive to light and accommodation III,IV, VI: ptosis not present, extra-ocular motions intact bilaterally V,VII: smile symmetric, facial  light touch sensation normal bilaterally VIII: hearing normal bilaterally IX,X: gag reflex present XI: bilateral shoulder shrug XII: midline tongue extension Motor: Generalized weakness with LUE drift.  Sensory: Pinprick and light touch intact throughout, bilaterally Deep Tendon Reflexes: 1+ and symmetric throughout Plantars: Right: downgoing   Left: downgoing Cerebellar: normal finger-to-nose Gait: not tested      Laboratory Studies:   Basic Metabolic Panel: Recent Labs  Lab 08/24/18 2124  NA 135  K 3.9  CL 101  CO2 25  GLUCOSE 130*  BUN 27*  CREATININE 1.20*  CALCIUM 9.0    Liver Function Tests: Recent Labs  Lab 08/24/18 2124  AST 22  ALT 13  ALKPHOS 75  BILITOT 0.9  PROT 6.9  ALBUMIN 4.3   No results for input(s): LIPASE, AMYLASE in the last 168 hours. No results for input(s): AMMONIA in the last 168 hours.  CBC: Recent Labs  Lab 08/24/18 2124  WBC 9.0  NEUTROABS 5.9  HGB 12.4  HCT 35.2*  MCV 104.1*  PLT 451*    Cardiac Enzymes: No results for input(s): CKTOTAL, CKMB, CKMBINDEX, TROPONINI in the last 168 hours.  BNP: Invalid input(s): POCBNP  CBG: No results for input(s): GLUCAP in the last 168 hours.  Microbiology: Results for orders placed or performed during the hospital encounter of 08/24/18  SARS Coronavirus 2 (CEPHEID - Performed in [facility] hospital lab), Hosp Order     Status: None   Collection Time: 08/24/18 10:35 PM   Specimen: Nasopharyngeal Swab  Result Value Ref Range Status   SARS Coronavirus 2 NEGATIVE NEGATIVE Final  Comment: (NOTE) If result is NEGATIVE SARS-CoV-2 target nucleic acids are NOT DETECTED. The SARS-CoV-2 RNA is generally detectable in upper and lower  respiratory specimens during the acute phase of infection. The lowest  concentration of SARS-CoV-2 viral copies this assay can detect is 250  copies / mL. A negative result does not preclude SARS-CoV-2 infection  and should not be used as the sole  basis for treatment or other  patient management decisions.  A negative result may occur with  improper specimen collection / handling, submission of specimen other  than nasopharyngeal swab, presence of viral mutation(s) within the  areas targeted by this assay, and inadequate number of viral copies  (<250 copies / mL). A negative result must be combined with clinical  observations, patient history, and epidemiological information. If result is POSITIVE SARS-CoV-2 target nucleic acids are DETECTED. The SARS-CoV-2 RNA is generally detectable in upper and lower  respiratory specimens dur ing the acute phase of infection.  Positive  results are indicative of active infection with SARS-CoV-2.  Clinical  correlation with patient history and other diagnostic information is  necessary to determine patient infection status.  Positive results do  not rule out bacterial infection or co-infection with other viruses. If result is PRESUMPTIVE POSTIVE SARS-CoV-2 nucleic acids MAY BE PRESENT.   A presumptive positive result was obtained on the submitted specimen  and confirmed on repeat testing.  While Apr 25, 2017 novel coronavirus  (SARS-CoV-2) nucleic acids may be present in the submitted sample  additional confirmatory testing may be necessary for epidemiological  and / or clinical management purposes  to differentiate between  SARS-CoV-2 and other Sarbecovirus currently known to infect humans.  If clinically indicated additional testing with an alternate test  methodology [phone]) is advised. The SARS-CoV-2 RNA is generally  detectable in upper and lower respiratory sp ecimens during the acute  phase of infection. The expected result is Negative. Fact Sheet for Patients:  StrictlyIdeas.no Fact Sheet for Healthcare Providers: BankingDealers.co.za This test is not yet approved or cleared by the [place] FDA and has been authorized for detection  and/or diagnosis of SARS-CoV-2 by FDA under an Emergency Use Authorization (EUA).  This EUA will remain in effect (meaning this test can be used) for the duration of the COVID-19 declaration under Section 564(b)(1) of the Act, 21 U.S.C. section 360bbb-3(b)(1), unless the authorization is terminated or revoked sooner. Performed at [facility], [address] [place], [place] [address]     Coagulation Studies: Recent Labs    08/24/18 2122/04/26  LABPROT 13.2  INR 1.0    Urinalysis:  Recent Labs  Lab 08/25/18 0753  COLORURINE YELLOW*  LABSPEC 1.013  PHURINE 6.0  GLUCOSEU NEGATIVE  HGBUR NEGATIVE  BILIRUBINUR NEGATIVE  KETONESUR NEGATIVE  PROTEINUR NEGATIVE  NITRITE POSITIVE*  LEUKOCYTESUR LARGE*    Lipid Panel:     Component Value Date/Time   CHOL 174 08/25/2018 0413   TRIG 83 08/25/2018 0413   HDL 43 08/25/2018 0413   CHOLHDL 4.0 08/25/2018 0413   VLDL 17 08/25/2018 0413   LDLCALC 114 (H) 08/25/2018 0413    HgbA1C: No results found for: HGBA1C  Urine Drug Screen:      Component Value Date/Time   LABOPIA NONE DETECTED 08/25/2018 0753   COCAINSCRNUR NONE DETECTED 08/25/2018 0753   LABBENZ NONE DETECTED 08/25/2018 0753   AMPHETMU NONE DETECTED 08/25/2018 0753   THCU NONE DETECTED 08/25/2018 0753   LABBARB NONE DETECTED 08/25/2018 0753    Alcohol Level:  Recent  Labs  Lab 08/24/18 2124  ETH <10    Other results: EKG: normal EKG, normal sinus rhythm, unchanged from previous tracings.  Imaging: Ct Head Wo Contrast  Result Date: 08/24/2018 CLINICAL DATA:  Initial evaluation for acute weakness. EXAM: CT HEAD WITHOUT CONTRAST TECHNIQUE: Contiguous axial images were obtained from the base of the skull through the vertex without intravenous contrast. COMPARISON:  None available. FINDINGS: Brain: Moderately advanced age-related cerebral atrophy with chronic small vessel ischemic disease. Evolving cytotoxic edema seen involving the anterior right insula,  with extension into the overlying right frontal operculum, compatible with evolving acute ischemic right MCA territory infarct. No associated hemorrhage or significant mass effect. No other acute large vessel territory infarct. No intracranial hemorrhage. No mass lesion, midline shift, or significant mass effect. No hydrocephalus. No extra-axial fluid collection. Vascular: No hyperdense vessel. Scattered vascular calcifications noted within the carotid siphons. Skull: Scalp soft tissues and calvarium within normal limits. Sinuses/Orbits: Globes and orbital soft tissues within normal limits. Paranasal sinuses are largely clear. No mastoid effusion. Other: None. IMPRESSION: 1. Evolving acute ischemic right MCA territory infarct involving the anterior right insula and overlying right frontal lobe. No associated hemorrhage or significant mass effect. 2. Underlying moderately advanced age-related cerebral atrophy with chronic microvascular ischemic disease. Electronically Signed   By: Jeannine Boga M.D.   On: 08/24/2018 23:05   Mr Brain Wo Contrast  Result Date: 08/25/2018 CLINICAL DATA:  Focal neuro deficit, greater than 6 hours, suspected stroke. EXAM: MRI HEAD WITHOUT CONTRAST TECHNIQUE: Multiplanar, multiecho pulse sequences of the brain and surrounding structures were obtained without intravenous contrast. COMPARISON:  Head CT 08/24/2018 FINDINGS: Brain: Mildly motion degraded examination. There is a moderate-sized region of restricted diffusion within the right MCA territory centered within the right frontal operculum and extending to the right insula, consistent with known acute ischemic infarction. The infarct is similar in extent as compared to head CT 08/24/2018. There is corresponding parenchymal T2/FLAIR hyperintensity and mild regional mass effect. No midline shift. No evidence of intracranial hemorrhage. No extra-axial collection. Moderate generalized parenchymal atrophy. Severe scattered and  confluent T2/FLAIR hyperintensity within the cerebral white matter is nonspecific, but consistent with chronic small vessel ischemic disease. Vascular: No definite loss of flow voids within the proximal large vessels. Skull and upper cervical spine: Normal marrow signal. Sinuses/Orbits: Bilateral lens replacements.The imaged globes and orbits are otherwise unremarkable. Mild ethmoid and maxillary sinus mucosal thickening. Trace left mastoid effusion. IMPRESSION: Moderate-sized acute ischemic infarction within the right MCA territory, centered within the right frontal operculum and extending to the right insula. The infarct is similar in extent as compared to head CT 08/24/2018. Mild regional mass effect. No midline shift or evidence of intracranial hemorrhage. Moderate generalized parenchymal atrophy. Severe chronic small vessel ischemic disease. Electronically Signed   By: Kellie Simmering   On: 08/25/2018 09:16   US Carotid Bilateral (at [facility] And [facility] Only)  Result Date: 08/25/2018 CLINICAL DATA:  85 year old female with a history of stroke EXAM: BILATERAL CAROTID DUPLEX ULTRASOUND TECHNIQUE: Pearline Cables scale imaging, color Doppler and duplex ultrasound were performed of bilateral carotid and vertebral arteries in the neck. COMPARISON:  None. FINDINGS: Criteria: Quantification of carotid stenosis is based on velocity parameters that correlate the residual internal carotid diameter with NASCET-based stenosis levels, using the diameter of the distal internal carotid lumen as the denominator for stenosis measurement. The following velocity measurements were obtained: RIGHT ICA:  Systolic 52 cm/sec, Diastolic 11 cm/sec CCA:  69 cm/sec SYSTOLIC ICA/CCA RATIO:  0.8 ECA:  104 cm/sec LEFT ICA:  Systolic 58 cm/sec, Diastolic 16 cm/sec CCA:  71 cm/sec SYSTOLIC ICA/CCA RATIO:  0.8 ECA:  131 cm/sec Right Brachial SBP: Not acquired Left Brachial SBP: Not acquired RIGHT CAROTID ARTERY: No significant calcifications of the right common  carotid artery. Intermediate waveform maintained. Heterogeneous and partially calcified plaque at the right carotid bifurcation. No significant lumen shadowing. Low resistance waveform of the right ICA. No significant tortuosity. RIGHT VERTEBRAL ARTERY: Antegrade flow with low resistance waveform. LEFT CAROTID ARTERY: No significant calcifications of the left common carotid artery. Intermediate waveform maintained. Heterogeneous and partially calcified plaque at the left carotid bifurcation without significant lumen shadowing. Low resistance waveform of the left ICA. No significant tortuosity. LEFT VERTEBRAL ARTERY:  Antegrade flow with low resistance waveform. IMPRESSION: Color duplex indicates minimal heterogeneous and calcified plaque, with no hemodynamically significant stenosis by duplex criteria in the extracranial cerebrovascular circulation. Signed, Dulcy Fanny. Dellia Nims, RPVI [facility] Electronically Signed   By: Corrie Mckusick D.O.   On: 08/25/2018 10:21     Assessment/Plan:  85 y.o. female with essential thrombocytosis, hyperlipidemia, hypertension, hypothyroidism and history of breast cancer presents to the emergency department due to facial drooping.  The patient's daughter noted that her mom was having difficulty drinking day prior.  She denies weakness, numbness or tingling in her extremities.  However, her daughter feels as if her mom speech is somewhat changed.  CT of her head shows right frontal ischemic stroke.   MRI with R superior division stroke in the R MCA territory.    - As per family no consistent anti platelet use prior to admission  - She appears better then expected given the location and size of stroke - Pt/ot/speech - Agree with ASA 325 and statin as ordered - no further imaging at this time - might need placement assistance as lives with husband who is 25.  - d/w daughter at bedside.   08/25/2018, 11:29 AM

## 2018-08-25 NOTE — Progress Notes (Signed)
[place] [facility] [place] NAME: Rachel Durham    MR#:  [id number]  DATE OF BIRTH:  Jun 09, 1920  SUBJECTIVE:   Patient presented to the hospital due to left-sided facial droop and left arm weakness and noted to have an acute CVA.  Patient CT head and MRI confirmed a right frontal ischemic CVA. Patient denies any headache, nausea, vomiting or any other complaints.  Patient's daughter is at bedside.  REVIEW OF SYSTEMS:    Review of Systems  Constitutional: Negative for chills and fever.  HENT: Negative for congestion and tinnitus.   Eyes: Negative for blurred vision and double vision.  Respiratory: Negative for cough, shortness of breath and wheezing.   Cardiovascular: Negative for chest pain, orthopnea and PND.  Gastrointestinal: Negative for abdominal pain, diarrhea, nausea and vomiting.  Genitourinary: Negative for dysuria and hematuria.  Neurological: Positive for speech change and focal weakness (left upper ext). Negative for dizziness and sensory change.  All other systems reviewed and are negative.   Nutrition: Heart Healthy Tolerating Diet: Yes Tolerating PT: Await Eval.   DRUG ALLERGIES:   Allergies  Allergen Reactions  . Fosamax [Alendronate Sodium] Other (See Comments)    Reaction: unknown  . Raloxifene Other (See Comments)    Reaction: unknown    VITALS:  Blood pressure (!) 118/56, pulse 75, temperature 98.4 F (36.9 C), temperature source Oral, resp. rate 18, height 5\' 2"  (1.575 m), weight 54.4 kg, SpO2 95 %.  PHYSICAL EXAMINATION:   Physical Exam  GENERAL:  85 y.o.-year-old patient lying in bed in no acute distress.  EYES: Pupils equal, round, reactive to light and accommodation. No scleral icterus. Extraocular muscles intact.  HEENT: Head atraumatic, normocephalic. Oropharynx and nasopharynx clear.  NECK:  Supple, no jugular venous distention. No thyroid enlargement, no tenderness.  LUNGS: Normal breath sounds  bilaterally, no wheezing, rales, rhonchi. No use of accessory muscles of respiration.  CARDIOVASCULAR: S1, S2 normal. No murmurs, rubs, or gallops.  ABDOMEN: Soft, nontender, nondistended. Bowel sounds present. No organomegaly or mass.  EXTREMITIES: No cyanosis, clubbing or edema b/l.    NEUROLOGIC: Cranial nerves II through XII are intact. Left upper ext. Weakness 3/5 strength, LLE weakness 4/5 strength.     PSYCHIATRIC: The patient is alert and oriented x 3.  SKIN: No obvious rash, lesion, or ulcer.    LABORATORY PANEL:   CBC Recent Labs  Lab 08/24/18 2124  WBC 9.0  HGB 12.4  HCT 35.2*  PLT 451*   ------------------------------------------------------------------------------------------------------------------  Chemistries  Recent Labs  Lab 08/24/18 2124  NA 135  K 3.9  CL 101  CO2 25  GLUCOSE 130*  BUN 27*  CREATININE 1.20*  CALCIUM 9.0  AST 22  ALT 13  ALKPHOS 75  BILITOT 0.9   ------------------------------------------------------------------------------------------------------------------  Cardiac Enzymes No results for input(s): TROPONINI in the last 168 hours. ------------------------------------------------------------------------------------------------------------------  RADIOLOGY:  Ct Head Wo Contrast  Result Date: 08/24/2018 CLINICAL DATA:  Initial evaluation for acute weakness. EXAM: CT HEAD WITHOUT CONTRAST TECHNIQUE: Contiguous axial images were obtained from the base of the skull through the vertex without intravenous contrast. COMPARISON:  None available. FINDINGS: Brain: Moderately advanced age-related cerebral atrophy with chronic small vessel ischemic disease. Evolving cytotoxic edema seen involving the anterior right insula, with extension into the overlying right frontal operculum, compatible with evolving acute ischemic right MCA territory infarct. No associated hemorrhage or significant mass effect. No other acute large vessel territory  infarct. No intracranial hemorrhage. No  mass lesion, midline shift, or significant mass effect. No hydrocephalus. No extra-axial fluid collection. Vascular: No hyperdense vessel. Scattered vascular calcifications noted within the carotid siphons. Skull: Scalp soft tissues and calvarium within normal limits. Sinuses/Orbits: Globes and orbital soft tissues within normal limits. Paranasal sinuses are largely clear. No mastoid effusion. Other: None. IMPRESSION: 1. Evolving acute ischemic right MCA territory infarct involving the anterior right insula and overlying right frontal lobe. No associated hemorrhage or significant mass effect. 2. Underlying moderately advanced age-related cerebral atrophy with chronic microvascular ischemic disease. Electronically Signed   By: Jeannine Boga M.D.   On: 08/24/2018 23:05   Mr Brain Wo Contrast  Result Date: 08/25/2018 CLINICAL DATA:  Focal neuro deficit, greater than 6 hours, suspected stroke. EXAM: MRI HEAD WITHOUT CONTRAST TECHNIQUE: Multiplanar, multiecho pulse sequences of the brain and surrounding structures were obtained without intravenous contrast. COMPARISON:  Head CT 08/24/2018 FINDINGS: Brain: Mildly motion degraded examination. There is a moderate-sized region of restricted diffusion within the right MCA territory centered within the right frontal operculum and extending to the right insula, consistent with known acute ischemic infarction. The infarct is similar in extent as compared to head CT 08/24/2018. There is corresponding parenchymal T2/FLAIR hyperintensity and mild regional mass effect. No midline shift. No evidence of intracranial hemorrhage. No extra-axial collection. Moderate generalized parenchymal atrophy. Severe scattered and confluent T2/FLAIR hyperintensity within the cerebral white matter is nonspecific, but consistent with chronic small vessel ischemic disease. Vascular: No definite loss of flow voids within the proximal large vessels.  Skull and upper cervical spine: Normal marrow signal. Sinuses/Orbits: Bilateral lens replacements.The imaged globes and orbits are otherwise unremarkable. Mild ethmoid and maxillary sinus mucosal thickening. Trace left mastoid effusion. IMPRESSION: Moderate-sized acute ischemic infarction within the right MCA territory, centered within the right frontal operculum and extending to the right insula. The infarct is similar in extent as compared to head CT 08/24/2018. Mild regional mass effect. No midline shift or evidence of intracranial hemorrhage. Moderate generalized parenchymal atrophy. Severe chronic small vessel ischemic disease. Electronically Signed   By: Kellie Simmering   On: 08/25/2018 09:16   US Carotid Bilateral (at [facility] And [facility] Only)  Result Date: 08/25/2018 CLINICAL DATA:  85 year old female with a history of stroke EXAM: BILATERAL CAROTID DUPLEX ULTRASOUND TECHNIQUE: Pearline Cables scale imaging, color Doppler and duplex ultrasound were performed of bilateral carotid and vertebral arteries in the neck. COMPARISON:  None. FINDINGS: Criteria: Quantification of carotid stenosis is based on velocity parameters that correlate the residual internal carotid diameter with NASCET-based stenosis levels, using the diameter of the distal internal carotid lumen as the denominator for stenosis measurement. The following velocity measurements were obtained: RIGHT ICA:  Systolic 52 cm/sec, Diastolic 11 cm/sec CCA:  69 cm/sec SYSTOLIC ICA/CCA RATIO:  0.8 ECA:  104 cm/sec LEFT ICA:  Systolic 58 cm/sec, Diastolic 16 cm/sec CCA:  71 cm/sec SYSTOLIC ICA/CCA RATIO:  0.8 ECA:  131 cm/sec Right Brachial SBP: Not acquired Left Brachial SBP: Not acquired RIGHT CAROTID ARTERY: No significant calcifications of the right common carotid artery. Intermediate waveform maintained. Heterogeneous and partially calcified plaque at the right carotid bifurcation. No significant lumen shadowing. Low resistance waveform of the right ICA. No significant  tortuosity. RIGHT VERTEBRAL ARTERY: Antegrade flow with low resistance waveform. LEFT CAROTID ARTERY: No significant calcifications of the left common carotid artery. Intermediate waveform maintained. Heterogeneous and partially calcified plaque at the left carotid bifurcation without significant lumen shadowing. Low resistance waveform of the left ICA. No significant tortuosity. LEFT  VERTEBRAL ARTERY:  Antegrade flow with low resistance waveform. IMPRESSION: Color duplex indicates minimal heterogeneous and calcified plaque, with no hemodynamically significant stenosis by duplex criteria in the extracranial cerebrovascular circulation. Signed, Dulcy Fanny. Dellia Nims, RPVI [facility] Electronically Signed   By: Corrie Mckusick D.O.   On: 08/25/2018 10:21     ASSESSMENT AND PLAN:   85 year old female with past medical history of essential thrombocytosis, breast cancer, hypertension, hyperlipidemia, hypothyroidism presented to the hospital due to left upper extremity weakness, sided facial droop and aphasia noted to have an acute CVA.  1.  Acute CVA- this is the cause of patient's left upper extremity weakness. -Patient CT head and MRI confirmed a right MCA territory ischemic CVA. - Seen by neurology, continue aspirin, added high-dose intensity statin. -Await PT, OT evaluation.  Carotid duplex is negative for carotid artery stenosis, echocardiogram pending.  2.  Essential thrombocytosis diastolic count is stable. -Continue hydroxyurea.  3.  Hypothyroidism-continue Synthroid.  4.  Essential hypertension-continue lisinopril/HCTZ.  Discussed plan of care with pt's daughter at bedside.    All the records are reviewed and case discussed with Care Management/Social Worker. Management plans discussed with the patient, family and they are in agreement.  CODE STATUS: DNR  DVT Prophylaxis: Hep SQ  TOTAL TIME TAKING CARE OF THIS PATIENT: 30  minutes.   POSSIBLE D/C IN 2-3 DAYS, DEPENDING ON CLINICAL CONDITION.   Henreitta Leber M.D on 08/25/2018 at 1:34 PM  Between 7am to 6pm - Pager - [phone]  After 6pm go to [email or web address] - Proofreader  [facility]  Office  [phone]  CC: Primary care physician; Madelyn Brunner, MD

## 2018-08-25 NOTE — TOC Initial Note (Addendum)
Transition of Care [facility]) - Initial/Assessment Note    Patient Details  Name: Rachel Durham MRN: [id number] Date of Birth: May 05, 1920  Transition [facility]) CM/SW Contact:    Shelbie Hutching, RN Phone Number: 08/25/2018, 3:55 PM  Clinical Narrative:                 Patient admitted with stroke.  Physical therapy and occupational therapy both recommend SNF at discharge.  Patient states "not interested".  Daughter Joseph Art is at the bedside and other daughter Dory Larsen called on the phone and spoken with over speaker while in the room with patient.  Discussed with both daughter's and patient about PT and OT recommendations and safety concerns with returning home, discussed option for home health and hiring a caregiver to come in.  Daughters would like to consider the option for SNF- patient has been confused today but is adamant about wanting to go home.  Patient is from home with her husband.  RNCM informed patient that bed search will be started for SNF but we will follow and see how she progresses over the next couple of days.  Bed search started RNCM will cont to follow.    Expected Discharge Plan: [place] Barriers to Discharge: Continued Medical Work up   Patient Goals and CMS Choice Patient states their goals for this hospitalization and ongoing recovery are:: Patient wants to go back home with her husband CMS Medicare.gov Compare Post Acute Care list provided to:: Patient Represenative (must comment)(Daughter) Choice offered to / list presented to : Adult Children  Expected Discharge Plan and Services Expected Discharge Plan: [place]   Discharge Planning Services: CM Consult Post Acute Care Choice: Home Health Living arrangements for the past 2 months: Single Family Home Expected Discharge Date: 08/26/18                                    Prior Living Arrangements/Services Living arrangements for the past 2 months: Single Family  Home Lives with:: Spouse Patient language and need for interpreter reviewed:: No Do you feel safe going back to the place where you live?: Yes      Need for Family Participation in Patient Care: Yes (Comment)(stroke) Care giver support system in place?: Yes (comment)(husband and daughters)   Criminal Activity/Legal Involvement Pertinent to Current Situation/Hospitalization: No - Comment as needed  Activities of Daily Living Home Assistive Devices/Equipment: None ADL Screening (condition at time of admission) Patient's cognitive ability adequate to safely complete daily activities?: Yes Is the patient deaf or have difficulty hearing?: No Does the patient have difficulty seeing, even when wearing glasses/contacts?: No Does the patient have difficulty concentrating, remembering, or making decisions?: No Patient able to express need for assistance with ADLs?: Yes Does the patient have difficulty dressing or bathing?: Yes Independently performs ADLs?: No Communication: Independent Dressing (OT): Needs assistance Is this a change from baseline?: Change from baseline, expected to last <3days Grooming: Needs assistance Is this a change from baseline?: Change from baseline, expected to last <3 days Feeding: Independent Bathing: Needs assistance Is this a change from baseline?: Change from baseline, expected to last <3 days Toileting: Needs assistance Is this a change from baseline?: Change from baseline, expected to last <3 days In/Out Bed: Needs assistance Is this a change from baseline?: Change from baseline, expected to last <3 days Walks in Home: Needs assistance Is  this a change from baseline?: Change from baseline, expected to last <3 days Does the patient have difficulty walking or climbing stairs?: Yes Weakness of Legs: None Weakness of Arms/Hands: None  Permission Sought/Granted Permission sought to share information with : Facility Sport and exercise psychologist, Tourist information centre manager, Family  Supports Permission granted to share information with : Yes, Verbal Permission Granted     Permission granted to share info w AGENCY: SNF  Permission granted to share info w Relationship: daughters     Emotional Assessment Appearance:: Appears stated age Attitude/Demeanor/Rapport: Engaged Affect (typically observed): Withdrawn Orientation: : Oriented to Self Alcohol / Substance Use: Not Applicable Psych Involvement: No (comment)  Admission diagnosis:  Cerebrovascular accident (CVA), unspecified mechanism ([place]) [I63.9] Patient Active Problem List   Diagnosis Date Noted  . Stroke (cerebrum) ([place]) 08/24/2018  . Essential thrombocytosis ([place]) 01/04/2016   PCP:  Madelyn Brunner, MD Pharmacy:   [id number] - [place], [place] - [address] [place] [address] Phone: [phone] Fax: [phone]     Social Determinants of Health (SDOH) Interventions    Readmission Risk Interventions No flowsheet data found.

## 2018-08-25 NOTE — Progress Notes (Signed)
PT Cancellation Note  Patient Details Name: Rachel Durham MRN: [id number] DOB: 1921-01-09   Cancelled Treatment:    Reason Eval/Treat Not Completed: Patient at procedure or test/unavailable. Chart-reviewed. PT attempted. Nurse communicated and pt is out for MRI testing. Will attempted later time/date as appropriated.   Sherrilyn Rist, SPT 08/25/2018, 9:02 AM

## 2018-08-25 NOTE — Progress Notes (Signed)
SLP Cancellation Note  Patient Details Name: DAMARIA VACHON MRN: [id number] DOB: Jun 28, 1920   Cancelled treatment:       Reason Eval/Treat Not Completed: Patient at procedure or test/unavailable(attempted in room visit; will f/u today or tomorrow as able). Pt is able to achieve verbal communication; A/O per MD note. Dysarthria has been noted; Left facial droop. Noted Dtr at bedside.     Orinda Kenner, MS, CCC-SLP Watson,Katherine 08/25/2018, 3:38 PM

## 2018-08-25 NOTE — Progress Notes (Signed)
OT Cancellation Note  Patient Details Name: Rachel Durham MRN: [id number] DOB: 30-Mar-1920   Cancelled Treatment:    Reason Eval/Treat Not Completed: Patient at procedure or test/ unavailable. Order received, chart reviewed. Pt out of room for testing. Will re-attempt OT evaluation at later date/time as pt is available and medically appropriate.  Jeni Salles, MPH, MS, OTR/L ascom [phone] 08/25/18, 10:23 AM

## 2018-08-26 LAB — HEMOGLOBIN A1C
Hgb A1c MFr Bld: 5 % (ref 4.8–5.6)
Mean Plasma Glucose: 97 mg/dL

## 2018-08-26 LAB — ECHOCARDIOGRAM COMPLETE
Height: 62 in
Weight: 1918.88 oz

## 2018-08-26 MED ORDER — ATORVASTATIN CALCIUM 40 MG PO TABS: 40.0000 mg | ORAL_TABLET | Freq: Every day | ORAL | Status: AC

## 2018-08-26 MED ORDER — POLYETHYLENE GLYCOL 3350 17 G PO PACK: 17.0000 g | PACK | Freq: Every day | ORAL | Status: DC | PRN

## 2018-08-26 MED ORDER — ASPIRIN EC 81 MG PO TBEC: 81.0000 mg | DELAYED_RELEASE_TABLET | Freq: Every day | ORAL | Status: AC

## 2018-08-26 MED ORDER — CEFUROXIME AXETIL 250 MG PO TABS: 250.0000 mg | ORAL_TABLET | Freq: Two times a day (BID) | ORAL | 0 refills | Status: AC

## 2018-08-26 NOTE — Progress Notes (Signed)
[facility] NAME: Rachel Durham    MR#:  [id number]  DATE OF BIRTH:  1920/04/23  SUBJECTIVE:   No acute events overnight, a bit lethargic today, left arm weakness is slightly improved.  Patient's daughter is at bedside.  No other acute events overnight.  REVIEW OF SYSTEMS:    Review of Systems  Constitutional: Negative for chills and fever.  HENT: Negative for congestion and tinnitus.   Eyes: Negative for blurred vision and double vision.  Respiratory: Negative for cough, shortness of breath and wheezing.   Cardiovascular: Negative for chest pain, orthopnea and PND.  Gastrointestinal: Negative for abdominal pain, diarrhea, nausea and vomiting.  Genitourinary: Negative for dysuria and hematuria.  Neurological: Positive for speech change and focal weakness (left upper ext). Negative for dizziness and sensory change.  All other systems reviewed and are negative.   Nutrition: Heart Healthy Tolerating Diet: Yes Tolerating PT: Eval noted.   DRUG ALLERGIES:   Allergies  Allergen Reactions  . Fosamax [Alendronate Sodium] Other (See Comments)    Reaction: unknown  . Raloxifene Other (See Comments)    Reaction: unknown    VITALS:  Blood pressure (!) 165/70, pulse (!) 103, temperature (!) 97.4 F (36.3 C), temperature source Oral, resp. rate 16, height 5\' 2"  (1.575 m), weight 54.4 kg, SpO2 97 %.  PHYSICAL EXAMINATION:   Physical Exam  GENERAL:  85 y.o.-year-old patient lying in bed in no acute distress.  EYES: Pupils equal, round, reactive to light and accommodation. No scleral icterus. Extraocular muscles intact.  HEENT: Head atraumatic, normocephalic. Oropharynx and nasopharynx clear.  NECK:  Supple, no jugular venous distention. No thyroid enlargement, no tenderness.  LUNGS: Normal breath sounds bilaterally, no wheezing, rales, rhonchi. No use of accessory muscles of respiration.  CARDIOVASCULAR: S1, S2 normal. No  murmurs, rubs, or gallops.  ABDOMEN: Soft, nontender, nondistended. Bowel sounds present. No organomegaly or mass.  EXTREMITIES: No cyanosis, clubbing or edema b/l.    NEUROLOGIC: Cranial nerves II through XII are intact. Left upper ext. Weakness 3/5 strength, LLE weakness 4/5 strength.     PSYCHIATRIC: The patient is alert and oriented x 2.  SKIN: No obvious rash, lesion, or ulcer.    LABORATORY PANEL:   CBC Recent Labs  Lab 08/24/18 2124  WBC 9.0  HGB 12.4  HCT 35.2*  PLT 451*   ------------------------------------------------------------------------------------------------------------------  Chemistries  Recent Labs  Lab 08/24/18 2124  NA 135  K 3.9  CL 101  CO2 25  GLUCOSE 130*  BUN 27*  CREATININE 1.20*  CALCIUM 9.0  AST 22  ALT 13  ALKPHOS 75  BILITOT 0.9   ------------------------------------------------------------------------------------------------------------------  Cardiac Enzymes No results for input(s): TROPONINI in the last 168 hours. ------------------------------------------------------------------------------------------------------------------  RADIOLOGY:  Ct Head Wo Contrast  Result Date: 08/24/2018 CLINICAL DATA:  Initial evaluation for acute weakness. EXAM: CT HEAD WITHOUT CONTRAST TECHNIQUE: Contiguous axial images were obtained from the base of the skull through the vertex without intravenous contrast. COMPARISON:  None available. FINDINGS: Brain: Moderately advanced age-related cerebral atrophy with chronic small vessel ischemic disease. Evolving cytotoxic edema seen involving the anterior right insula, with extension into the overlying right frontal operculum, compatible with evolving acute ischemic right MCA territory infarct. No associated hemorrhage or significant mass effect. No other acute large vessel territory infarct. No intracranial hemorrhage. No mass lesion, midline shift, or significant mass effect. No hydrocephalus. No extra-axial  fluid collection. Vascular: No hyperdense vessel. Scattered vascular calcifications  noted within the carotid siphons. Skull: Scalp soft tissues and calvarium within normal limits. Sinuses/Orbits: Globes and orbital soft tissues within normal limits. Paranasal sinuses are largely clear. No mastoid effusion. Other: None. IMPRESSION: 1. Evolving acute ischemic right MCA territory infarct involving the anterior right insula and overlying right frontal lobe. No associated hemorrhage or significant mass effect. 2. Underlying moderately advanced age-related cerebral atrophy with chronic microvascular ischemic disease. Electronically Signed   By: Jeannine Boga M.D.   On: 08/24/2018 23:05   Mr Brain Wo Contrast  Result Date: 08/25/2018 CLINICAL DATA:  Focal neuro deficit, greater than 6 hours, suspected stroke. EXAM: MRI HEAD WITHOUT CONTRAST TECHNIQUE: Multiplanar, multiecho pulse sequences of the brain and surrounding structures were obtained without intravenous contrast. COMPARISON:  Head CT 08/24/2018 FINDINGS: Brain: Mildly motion degraded examination. There is a moderate-sized region of restricted diffusion within the right MCA territory centered within the right frontal operculum and extending to the right insula, consistent with known acute ischemic infarction. The infarct is similar in extent as compared to head CT 08/24/2018. There is corresponding parenchymal T2/FLAIR hyperintensity and mild regional mass effect. No midline shift. No evidence of intracranial hemorrhage. No extra-axial collection. Moderate generalized parenchymal atrophy. Severe scattered and confluent T2/FLAIR hyperintensity within the cerebral white matter is nonspecific, but consistent with chronic small vessel ischemic disease. Vascular: No definite loss of flow voids within the proximal large vessels. Skull and upper cervical spine: Normal marrow signal. Sinuses/Orbits: Bilateral lens replacements.The imaged globes and orbits are  otherwise unremarkable. Mild ethmoid and maxillary sinus mucosal thickening. Trace left mastoid effusion. IMPRESSION: Moderate-sized acute ischemic infarction within the right MCA territory, centered within the right frontal operculum and extending to the right insula. The infarct is similar in extent as compared to head CT 08/24/2018. Mild regional mass effect. No midline shift or evidence of intracranial hemorrhage. Moderate generalized parenchymal atrophy. Severe chronic small vessel ischemic disease. Electronically Signed   By: Kellie Simmering   On: 08/25/2018 09:16   US Carotid Bilateral (at [facility] Only)  Result Date: 08/25/2018 CLINICAL DATA:  85 year old female with a history of stroke EXAM: BILATERAL CAROTID DUPLEX ULTRASOUND TECHNIQUE: Pearline Cables scale imaging, color Doppler and duplex ultrasound were performed of bilateral carotid and vertebral arteries in the neck. COMPARISON:  None. FINDINGS: Criteria: Quantification of carotid stenosis is based on velocity parameters that correlate the residual internal carotid diameter with NASCET-based stenosis levels, using the diameter of the distal internal carotid lumen as the denominator for stenosis measurement. The following velocity measurements were obtained: RIGHT ICA:  Systolic 52 cm/sec, Diastolic 11 cm/sec CCA:  69 cm/sec SYSTOLIC ICA/CCA RATIO:  0.8 ECA:  104 cm/sec LEFT ICA:  Systolic 58 cm/sec, Diastolic 16 cm/sec CCA:  71 cm/sec SYSTOLIC ICA/CCA RATIO:  0.8 ECA:  131 cm/sec Right Brachial SBP: Not acquired Left Brachial SBP: Not acquired RIGHT CAROTID ARTERY: No significant calcifications of the right common carotid artery. Intermediate waveform maintained. Heterogeneous and partially calcified plaque at the right carotid bifurcation. No significant lumen shadowing. Low resistance waveform of the right ICA. No significant tortuosity. RIGHT VERTEBRAL ARTERY: Antegrade flow with low resistance waveform. LEFT CAROTID ARTERY: No significant calcifications  of the left common carotid artery. Intermediate waveform maintained. Heterogeneous and partially calcified plaque at the left carotid bifurcation without significant lumen shadowing. Low resistance waveform of the left ICA. No significant tortuosity. LEFT VERTEBRAL ARTERY:  Antegrade flow with low resistance waveform. IMPRESSION: Color duplex indicates minimal heterogeneous and calcified plaque, with no hemodynamically  significant stenosis by duplex criteria in the extracranial cerebrovascular circulation. Signed, Dulcy Fanny. Dellia Nims, RPVI [facility] Electronically Signed   By: Corrie Mckusick D.O.   On: 08/25/2018 10:21     ASSESSMENT AND PLAN:   85 year old female with past medical history of essential thrombocytosis, breast cancer, hypertension, hyperlipidemia, hypothyroidism presented to the hospital due to left upper extremity weakness, sided facial droop and aphasia noted to have an acute CVA.  1.  Acute CVA- this is the cause of patient's left upper extremity weakness. -Patient CT head and MRI confirmed a right MCA territory ischemic CVA. - Appreciate neurology input, continue aspirin, added high-dose intensity statin. -Carotid duplex negative for acute pathology.  Cardiogram done showed normal ejection fraction with no intracardiac thrombus. -Appreciate PT and OT evaluation patient likely need short-term rehab upon discharge.  Family is in agreement.  2.  Essential thrombocytosis - pt's platelet count is stable. -Continue hydroxyurea.  3.  Hypothyroidism-continue Synthroid.  4.  Essential hypertension-continue lisinopril/HCTZ.  Discussed plan of care patient's daughter at bedside and she is in agreement with discharge to short-term rehab.  Social work is aware.  Working on placement.   All the records are reviewed and case discussed with Care Management/Social Worker. Management plans discussed with the patient, family and  they are in agreement.  CODE STATUS: DNR  DVT Prophylaxis: Hep SQ  TOTAL TIME TAKING CARE OF THIS PATIENT: 30 minutes.   POSSIBLE D/C IN 1-2 DAYS, DEPENDING ON CLINICAL CONDITION.   Henreitta Leber M.D on 08/26/2018 at 1:48 PM  Between 7am to 6pm - Pager - [phone]  After 6pm go to [email or web address] - Proofreader  [facility]  Office  [phone]  CC: Primary care physician; Madelyn Brunner, MD

## 2018-08-26 NOTE — Progress Notes (Signed)
PT Cancellation Note  Patient Details Name: Rachel Durham MRN: [id number] DOB: Jan 19, 1921   Cancelled Treatment:    Reason Eval/Treat Not Completed: Fatigue/lethargy limiting ability to participate Order received and chart reviewed. PT re-attempted at 11:05am. Pt was supine at bed when PT enters room and speech therapist presents as well. PT asked pt's permission for PT session and pt unable to provide clear responses. Speech therapist reworded the question and pt refuses the PT session and states:" feeling weak and wants to rest". PT will re-attempt later time/date as appropriate.    Sherrilyn Rist, SPT 08/26/2018, 11:09 AM

## 2018-08-26 NOTE — TOC Transition Note (Addendum)
Transition of Care [facility]) - CM/SW Discharge Note   Patient Details  Name: Rachel Durham MRN: [id number] Date of Birth: 07/05/1920  Transition of Care [facility]) CM/SW Contact:  Shelbie Hutching, RN Phone Number: 08/26/2018, 2:03 PM   Clinical Narrative:    Patient's family has decided on [facility] for patient to complete rehab.  [facility] can accept patient today, patient will transport via EMS.  Daughter reports that patient will not be happy with SNF but patient is not able to care for herself at home and her husband is not able either.  Daughter also reports that patient has some dementia.  Patient is going to room 510, bedside RN to call report to [phone].   Final next level of care: Skilled Nursing Facility Barriers to Discharge: Barriers Resolved   Patient Goals and CMS Choice Patient states their goals for this hospitalization and ongoing recovery are:: Patient wants to go back home with her husband CMS Medicare.gov Compare Post Acute Care list provided to:: Patient Represenative (must comment)(Patient's daughters) Choice offered to / list presented to : Adult Children  Discharge Placement PASRR number recieved: 08/25/18            Patient chooses bed at: [facility] Patient to be transferred to facility by: [facility] Name of family member notified: Timoteo Ace Patient and family notified of of transfer: 08/26/18  Discharge Plan and Services   Discharge Planning Services: CM Consult Post Acute Care Choice: Home Health                               Social Determinants of Health (SDOH) Interventions     Readmission Risk Interventions No flowsheet data found.

## 2018-08-26 NOTE — Progress Notes (Signed)
Pt sleeping soundly.  Did not wake as pt was up numerous times last night and was becoming agitated and more confused. Dorna Bloom RN

## 2018-08-26 NOTE — Discharge Summary (Signed)
[facility] NAME: Rachel Durham    MR#:  [id number]  DATE OF BIRTH:  04/07/1920  DATE OF ADMISSION:  08/24/2018 ADMITTING PHYSICIAN: Harrie Foreman, MD  DATE OF DISCHARGE: No discharge date for patient encounter.  PRIMARY CARE PHYSICIAN: Dion Body, MD    ADMISSION DIAGNOSIS:  Cerebrovascular accident (CVA), unspecified mechanism ([place]) [I63.9]  DISCHARGE DIAGNOSIS:  Active Problems:   Stroke (cerebrum) ([place])   SECONDARY DIAGNOSIS:   Past Medical History:  Diagnosis Date  . Breast cancer ([place])   . Essential thrombocytosis ([place])   . Hyperlipidemia   . Hypertension   . Hypothyroidism     HOSPITAL COURSE:   85 year old female with past medical history of essential thrombocytosis, breast cancer, hypertension, hyperlipidemia, hypothyroidism presented to the hospital due to left upper extremity weakness, sided facial droop and aphasia noted to have an acute CVA.  1.  Acute CVA- this is the cause of patient's left upper extremity weakness. -Patient CT head and MRI confirmed a right MCA territory ischemic CVA. -Patient was started on aspirin her lipid profile showed elevated cholesterol and started on high-dose intensity statin. -Seen by neurology and they agree with the current medication regimen.  Patient's carotid duplex showed no hemodynamically significant carotid artery stenosis and echocardiogram showed no intracardiac thrombus. -Seen by PT and OT and they recommend short-term rehab which is where patient is presently being discharged.  2.  Essential thrombocytosis - pt's platelet count is stable. - she will Continue hydroxyurea.  3.  Hypothyroidism- she will continue Synthroid.  4.  Essential hypertension- she will continue lisinopril/HCTZ.  DISCHARGE CONDITIONS:   Stable.   CONSULTS OBTAINED:  Treatment Team:  Leotis Pain, MD  DRUG ALLERGIES:   Allergies  Allergen Reactions  . Fosamax  [Alendronate Sodium] Other (See Comments)    Reaction: unknown  . Raloxifene Other (See Comments)    Reaction: unknown    DISCHARGE MEDICATIONS:   Allergies as of 08/26/2018      Reactions   Fosamax [alendronate Sodium] Other (See Comments)   Reaction: unknown   Raloxifene Other (See Comments)   Reaction: unknown      Medication List    TAKE these medications   aspirin EC 81 MG tablet Take 1 tablet (81 mg total) by mouth daily.   atorvastatin 40 MG tablet Commonly known as: LIPITOR Take 1 tablet (40 mg total) by mouth daily at 6 PM.   cefUROXime 250 MG tablet Commonly known as: CEFTIN Take 1 tablet (250 mg total) by mouth 2 (two) times daily with a meal for 3 days.   hydroxyurea 500 MG capsule Commonly known as: HYDREA TAKE 1 CAPSULE (500 MG TOTAL) BY MOUTH DAILY.   levothyroxine 88 MCG tablet Commonly known as: SYNTHROID Take 88 mcg by mouth daily.   lisinopril-hydrochlorothiazide 20-12.5 MG tablet Commonly known as: ZESTORETIC Take 1 tablet by mouth daily.         DISCHARGE INSTRUCTIONS:   DIET:  Cardiac diet  DISCHARGE CONDITION:  Stable  ACTIVITY:  Activity as tolerated  OXYGEN:  Home Oxygen: No.   Oxygen Delivery: room air  DISCHARGE LOCATION:  nursing home   If you experience worsening of your admission symptoms, develop shortness of breath, life threatening emergency, suicidal or homicidal thoughts you must seek medical attention immediately by calling 911 or calling your MD immediately  if symptoms less severe.  You Must read complete instructions/literature along with all the possible adverse reactions/side effects for  all the Medicines you take and that have been prescribed to you. Take any new Medicines after you have completely understood and accpet all the possible adverse reactions/side effects.   Please note  You were cared for by a hospitalist during your hospital stay. If you have any questions about your discharge medications or  the care you received while you were in the hospital after you are discharged, you can call the unit and asked to speak with the hospitalist on call if the hospitalist that took care of you is not available. Once you are discharged, your primary care physician will handle any further medical issues. Please note that NO REFILLS for any discharge medications will be authorized once you are discharged, as it is imperative that you return to your primary care physician (or establish a relationship with a primary care physician if you do not have one) for your aftercare needs so that they can reassess your need for medications and monitor your lab values.   DATA REVIEW:   CBC Recent Labs  Lab 08/24/18 2124  WBC 9.0  HGB 12.4  HCT 35.2*  PLT 451*    Chemistries  Recent Labs  Lab 08/24/18 2124  NA 135  K 3.9  CL 101  CO2 25  GLUCOSE 130*  BUN 27*  CREATININE 1.20*  CALCIUM 9.0  AST 22  ALT 13  ALKPHOS 75  BILITOT 0.9    Cardiac Enzymes No results for input(s): TROPONINI in the last 168 hours.  Microbiology Results  Results for orders placed or performed during the hospital encounter of 08/24/18  SARS Coronavirus 2 (CEPHEID - Performed in [place] hospital lab), Hosp Order     Status: None   Collection Time: 08/24/18 10:35 PM   Specimen: Nasopharyngeal Swab  Result Value Ref Range Status   SARS Coronavirus 2 NEGATIVE NEGATIVE Final    Comment: (NOTE) If result is NEGATIVE SARS-CoV-2 target nucleic acids are NOT DETECTED. The SARS-CoV-2 RNA is generally detectable in upper and lower  respiratory specimens during the acute phase of infection. The lowest  concentration of SARS-CoV-2 viral copies this assay can detect is 250  copies / mL. A negative result does not preclude SARS-CoV-2 infection  and should not be used as the sole basis for treatment or other  patient management decisions.  A negative result may occur with  improper specimen collection / handling, submission  of specimen other  than nasopharyngeal swab, presence of viral mutation(s) within the  areas targeted by this assay, and inadequate number of viral copies  (<250 copies / mL). A negative result must be combined with clinical  observations, patient history, and epidemiological information. If result is POSITIVE SARS-CoV-2 target nucleic acids are DETECTED. The SARS-CoV-2 RNA is generally detectable in upper and lower  respiratory specimens dur ing the acute phase of infection.  Positive  results are indicative of active infection with SARS-CoV-2.  Clinical  correlation with patient history and other diagnostic information is  necessary to determine patient infection status.  Positive results do  not rule out bacterial infection or co-infection with other viruses. If result is PRESUMPTIVE POSTIVE SARS-CoV-2 nucleic acids MAY BE PRESENT.   A presumptive positive result was obtained on the submitted specimen  and confirmed on repeat testing.  While 2019 novel coronavirus  (SARS-CoV-2) nucleic acids may be present in the submitted sample  additional confirmatory testing may be necessary for epidemiological  and / or clinical management purposes  to differentiate between  SARS-CoV-2  and other Sarbecovirus currently known to infect humans.  If clinically indicated additional testing with an alternate test  methodology [phone]) is advised. The SARS-CoV-2 RNA is generally  detectable in upper and lower respiratory sp ecimens during the acute  phase of infection. The expected result is Negative. Fact Sheet for Patients:  StrictlyIdeas.no Fact Sheet for Healthcare Providers: BankingDealers.co.za This test is not yet approved or cleared by the [place] FDA and has been authorized for detection and/or diagnosis of SARS-CoV-2 by FDA under an Emergency Use Authorization (EUA).  This EUA will remain in effect (meaning this test can be used) for  the duration of the COVID-19 declaration under Section 564(b)(1) of the Act, 21 U.S.C. section 360bbb-3(b)(1), unless the authorization is terminated or revoked sooner. Performed at [facility], [address] [place], [place] [address]     RADIOLOGY:  Ct Head Wo Contrast  Result Date: 08/24/2018 CLINICAL DATA:  Initial evaluation for acute weakness. EXAM: CT HEAD WITHOUT CONTRAST TECHNIQUE: Contiguous axial images were obtained from the base of the skull through the vertex without intravenous contrast. COMPARISON:  None available. FINDINGS: Brain: Moderately advanced age-related cerebral atrophy with chronic small vessel ischemic disease. Evolving cytotoxic edema seen involving the anterior right insula, with extension into the overlying right frontal operculum, compatible with evolving acute ischemic right MCA territory infarct. No associated hemorrhage or significant mass effect. No other acute large vessel territory infarct. No intracranial hemorrhage. No mass lesion, midline shift, or significant mass effect. No hydrocephalus. No extra-axial fluid collection. Vascular: No hyperdense vessel. Scattered vascular calcifications noted within the carotid siphons. Skull: Scalp soft tissues and calvarium within normal limits. Sinuses/Orbits: Globes and orbital soft tissues within normal limits. Paranasal sinuses are largely clear. No mastoid effusion. Other: None. IMPRESSION: 1. Evolving acute ischemic right MCA territory infarct involving the anterior right insula and overlying right frontal lobe. No associated hemorrhage or significant mass effect. 2. Underlying moderately advanced age-related cerebral atrophy with chronic microvascular ischemic disease. Electronically Signed   By: Jeannine Boga M.D.   On: 08/24/2018 23:05   Mr Brain Wo Contrast  Result Date: 08/25/2018 CLINICAL DATA:  Focal neuro deficit, greater than 6 hours, suspected stroke. EXAM: MRI HEAD WITHOUT CONTRAST  TECHNIQUE: Multiplanar, multiecho pulse sequences of the brain and surrounding structures were obtained without intravenous contrast. COMPARISON:  Head CT 08/24/2018 FINDINGS: Brain: Mildly motion degraded examination. There is a moderate-sized region of restricted diffusion within the right MCA territory centered within the right frontal operculum and extending to the right insula, consistent with known acute ischemic infarction. The infarct is similar in extent as compared to head CT 08/24/2018. There is corresponding parenchymal T2/FLAIR hyperintensity and mild regional mass effect. No midline shift. No evidence of intracranial hemorrhage. No extra-axial collection. Moderate generalized parenchymal atrophy. Severe scattered and confluent T2/FLAIR hyperintensity within the cerebral white matter is nonspecific, but consistent with chronic small vessel ischemic disease. Vascular: No definite loss of flow voids within the proximal large vessels. Skull and upper cervical spine: Normal marrow signal. Sinuses/Orbits: Bilateral lens replacements.The imaged globes and orbits are otherwise unremarkable. Mild ethmoid and maxillary sinus mucosal thickening. Trace left mastoid effusion. IMPRESSION: Moderate-sized acute ischemic infarction within the right MCA territory, centered within the right frontal operculum and extending to the right insula. The infarct is similar in extent as compared to head CT 08/24/2018. Mild regional mass effect. No midline shift or evidence of intracranial hemorrhage. Moderate generalized parenchymal atrophy. Severe chronic small vessel ischemic disease. Electronically Signed  By: Kellie Simmering   On: 08/25/2018 09:16   US Carotid Bilateral (at [facility] And [facility] Only)  Result Date: 08/25/2018 CLINICAL DATA:  85 year old female with a history of stroke EXAM: BILATERAL CAROTID DUPLEX ULTRASOUND TECHNIQUE: Pearline Cables scale imaging, color Doppler and duplex ultrasound were performed of bilateral carotid and  vertebral arteries in the neck. COMPARISON:  None. FINDINGS: Criteria: Quantification of carotid stenosis is based on velocity parameters that correlate the residual internal carotid diameter with NASCET-based stenosis levels, using the diameter of the distal internal carotid lumen as the denominator for stenosis measurement. The following velocity measurements were obtained: RIGHT ICA:  Systolic 52 cm/sec, Diastolic 11 cm/sec CCA:  69 cm/sec SYSTOLIC ICA/CCA RATIO:  0.8 ECA:  104 cm/sec LEFT ICA:  Systolic 58 cm/sec, Diastolic 16 cm/sec CCA:  71 cm/sec SYSTOLIC ICA/CCA RATIO:  0.8 ECA:  131 cm/sec Right Brachial SBP: Not acquired Left Brachial SBP: Not acquired RIGHT CAROTID ARTERY: No significant calcifications of the right common carotid artery. Intermediate waveform maintained. Heterogeneous and partially calcified plaque at the right carotid bifurcation. No significant lumen shadowing. Low resistance waveform of the right ICA. No significant tortuosity. RIGHT VERTEBRAL ARTERY: Antegrade flow with low resistance waveform. LEFT CAROTID ARTERY: No significant calcifications of the left common carotid artery. Intermediate waveform maintained. Heterogeneous and partially calcified plaque at the left carotid bifurcation without significant lumen shadowing. Low resistance waveform of the left ICA. No significant tortuosity. LEFT VERTEBRAL ARTERY:  Antegrade flow with low resistance waveform. IMPRESSION: Color duplex indicates minimal heterogeneous and calcified plaque, with no hemodynamically significant stenosis by duplex criteria in the extracranial cerebrovascular circulation. Signed, Dulcy Fanny. Dellia Nims, RPVI [facility] Electronically Signed   By: Corrie Mckusick D.O.   On: 08/25/2018 10:21      Management plans discussed with the patient, family and they are in agreement.  CODE STATUS:     Code Status Orders  (From admission, onward)          Start     Ordered   08/25/18 0136  Do not attempt resuscitation (DNR)  Continuous    Question Answer Comment  In the event of cardiac or respiratory ARREST Do not call a "code blue"   In the event of cardiac or respiratory ARREST Do not perform Intubation, CPR, defibrillation or ACLS   In the event of cardiac or respiratory ARREST Use medication by any route, position, wound care, and other measures to relive pain and suffering. May use oxygen, suction and manual treatment of airway obstruction as needed for comfort.      08/25/18 0135          TOTAL TIME TAKING CARE OF THIS PATIENT: 40 minutes.    Henreitta Leber M.D on 08/26/2018 at 2:06 PM  Between 7am to 6pm - Pager - [phone]  After 6pm go to [email or web address] - Proofreader  [facility]  Office  [phone]  CC: Primary care physician; Dion Body, MD

## 2018-08-26 NOTE — Progress Notes (Signed)
At times more confusion noted. Possibly delirium component   Past Medical History:  Diagnosis Date  . Breast cancer ([place])   . Essential thrombocytosis ([place])   . Hyperlipidemia   . Hypertension   . Hypothyroidism     Past Surgical History:  Procedure Laterality Date  . ABDOMINAL HYSTERECTOMY    . MASTECTOMY      History reviewed. No pertinent family history.  Social History:  reports that she has never smoked. She has never used smokeless tobacco. She reports that she does not drink alcohol or use drugs.  Allergies  Allergen Reactions  . Fosamax [Alendronate Sodium] Other (See Comments)    Reaction: unknown  . Raloxifene Other (See Comments)    Reaction: unknown    Medications: I have reviewed the patient's current medications.  ROS: Unable to obtain due to hearing impairment   Physical Examination: Blood pressure (!) 165/70, pulse (!) 103, temperature (!) 97.4 F (36.3 C), temperature source Oral, resp. rate 16, height 5\' 2"  (1.575 m), weight 54.4 kg, SpO2 97 %.   Neurological Examination   Mental Status: Alert to name, dysarthria present  Cranial Nerves: II: Discs flat bilaterally; Visual fields grossly normal, pupils equal, round, reactive to light and accommodation III,IV, VI: ptosis not present, extra-ocular motions intact bilaterally V,VII: smile symmetric, facial light touch sensation normal bilaterally VIII: hearing normal bilaterally IX,X: gag reflex present XI: bilateral shoulder shrug XII: midline tongue extension Motor: Generalized weakness with LUE drift.  Sensory: Pinprick and light touch intact throughout, bilaterally Deep Tendon Reflexes: 1+ and symmetric throughout Plantars: Right: downgoing   Left: downgoing Cerebellar: normal finger-to-nose Gait: not tested      Laboratory Studies:   Basic Metabolic Panel: Recent Labs  Lab 08/24/18 2124  NA 135  K 3.9  CL 101  CO2 25  GLUCOSE 130*  BUN 27*  CREATININE 1.20*  CALCIUM 9.0     Liver Function Tests: Recent Labs  Lab 08/24/18 2124  AST 22  ALT 13  ALKPHOS 75  BILITOT 0.9  PROT 6.9  ALBUMIN 4.3   No results for input(s): LIPASE, AMYLASE in the last 168 hours. No results for input(s): AMMONIA in the last 168 hours.  CBC: Recent Labs  Lab 08/24/18 2124  WBC 9.0  NEUTROABS 5.9  HGB 12.4  HCT 35.2*  MCV 104.1*  PLT 451*    Cardiac Enzymes: No results for input(s): CKTOTAL, CKMB, CKMBINDEX, TROPONINI in the last 168 hours.  BNP: Invalid input(s): POCBNP  CBG: No results for input(s): GLUCAP in the last 168 hours.  Microbiology: Results for orders placed or performed during the hospital encounter of 08/24/18  SARS Coronavirus 2 (CEPHEID - Performed in [place] hospital lab), Hosp Order     Status: None   Collection Time: 08/24/18 10:35 PM   Specimen: Nasopharyngeal Swab  Result Value Ref Range Status   SARS Coronavirus 2 NEGATIVE NEGATIVE Final    Comment: (NOTE) If result is NEGATIVE SARS-CoV-2 target nucleic acids are NOT DETECTED. The SARS-CoV-2 RNA is generally detectable in upper and lower  respiratory specimens during the acute phase of infection. The lowest  concentration of SARS-CoV-2 viral copies this assay can detect is 250  copies / mL. A negative result does not preclude SARS-CoV-2 infection  and should not be used as the sole basis for treatment or other  patient management decisions.  A negative result may occur with  improper specimen collection / handling, submission of specimen other  than nasopharyngeal swab, presence of  viral mutation(s) within the  areas targeted by this assay, and inadequate number of viral copies  (<250 copies / mL). A negative result must be combined with clinical  observations, patient history, and epidemiological information. If result is POSITIVE SARS-CoV-2 target nucleic acids are DETECTED. The SARS-CoV-2 RNA is generally detectable in upper and lower  respiratory specimens dur ing the  acute phase of infection.  Positive  results are indicative of active infection with SARS-CoV-2.  Clinical  correlation with patient history and other diagnostic information is  necessary to determine patient infection status.  Positive results do  not rule out bacterial infection or co-infection with other viruses. If result is PRESUMPTIVE POSTIVE SARS-CoV-2 nucleic acids MAY BE PRESENT.   A presumptive positive result was obtained on the submitted specimen  and confirmed on repeat testing.  While 05-02-2017 novel coronavirus  (SARS-CoV-2) nucleic acids may be present in the submitted sample  additional confirmatory testing may be necessary for epidemiological  and / or clinical management purposes  to differentiate between  SARS-CoV-2 and other Sarbecovirus currently known to infect humans.  If clinically indicated additional testing with an alternate test  methodology [phone]) is advised. The SARS-CoV-2 RNA is generally  detectable in upper and lower respiratory sp ecimens during the acute  phase of infection. The expected result is Negative. Fact Sheet for Patients:  StrictlyIdeas.no Fact Sheet for Healthcare Providers: BankingDealers.co.za This test is not yet approved or cleared by the [place] FDA and has been authorized for detection and/or diagnosis of SARS-CoV-2 by FDA under an Emergency Use Authorization (EUA).  This EUA will remain in effect (meaning this test can be used) for the duration of the COVID-19 declaration under Section 564(b)(1) of the Act, 21 U.S.C. section 360bbb-3(b)(1), unless the authorization is terminated or revoked sooner. Performed at [facility], [address] [place],  [address]     Coagulation Studies: Recent Labs    08/24/18 03-May-2122  LABPROT 13.2  INR 1.0    Urinalysis:  Recent Labs  Lab 08/25/18 0753  COLORURINE YELLOW*  LABSPEC 1.013  PHURINE 6.0  GLUCOSEU NEGATIVE   HGBUR NEGATIVE  BILIRUBINUR NEGATIVE  KETONESUR NEGATIVE  PROTEINUR NEGATIVE  NITRITE POSITIVE*  LEUKOCYTESUR LARGE*    Lipid Panel:     Component Value Date/Time   CHOL 174 08/25/2018 0413   TRIG 83 08/25/2018 0413   HDL 43 08/25/2018 0413   CHOLHDL 4.0 08/25/2018 0413   VLDL 17 08/25/2018 0413   LDLCALC 114 (H) 08/25/2018 0413    HgbA1C:  Lab Results  Component Value Date   HGBA1C 5.0 08/25/2018    Urine Drug Screen:      Component Value Date/Time   LABOPIA NONE DETECTED 08/25/2018 0753   COCAINSCRNUR NONE DETECTED 08/25/2018 0753   LABBENZ NONE DETECTED 08/25/2018 0753   AMPHETMU NONE DETECTED 08/25/2018 0753   THCU NONE DETECTED 08/25/2018 0753   LABBARB NONE DETECTED 08/25/2018 0753    Alcohol Level:  Recent Labs  Lab 08/24/18 2122/05/03  ETH <10    Other results: EKG: normal EKG, normal sinus rhythm, unchanged from previous tracings.  Imaging: Ct Head Wo Contrast  Result Date: 08/24/2018 CLINICAL DATA:  Initial evaluation for acute weakness. EXAM: CT HEAD WITHOUT CONTRAST TECHNIQUE: Contiguous axial images were obtained from the base of the skull through the vertex without intravenous contrast. COMPARISON:  None available. FINDINGS: Brain: Moderately advanced age-related cerebral atrophy with chronic small vessel ischemic disease. Evolving cytotoxic edema seen involving the anterior right insula,  with extension into the overlying right frontal operculum, compatible with evolving acute ischemic right MCA territory infarct. No associated hemorrhage or significant mass effect. No other acute large vessel territory infarct. No intracranial hemorrhage. No mass lesion, midline shift, or significant mass effect. No hydrocephalus. No extra-axial fluid collection. Vascular: No hyperdense vessel. Scattered vascular calcifications noted within the carotid siphons. Skull: Scalp soft tissues and calvarium within normal limits. Sinuses/Orbits: Globes and orbital soft tissues  within normal limits. Paranasal sinuses are largely clear. No mastoid effusion. Other: None. IMPRESSION: 1. Evolving acute ischemic right MCA territory infarct involving the anterior right insula and overlying right frontal lobe. No associated hemorrhage or significant mass effect. 2. Underlying moderately advanced age-related cerebral atrophy with chronic microvascular ischemic disease. Electronically Signed   By: Jeannine Boga M.D.   On: 08/24/2018 23:05   Mr Brain Wo Contrast  Result Date: 08/25/2018 CLINICAL DATA:  Focal neuro deficit, greater than 6 hours, suspected stroke. EXAM: MRI HEAD WITHOUT CONTRAST TECHNIQUE: Multiplanar, multiecho pulse sequences of the brain and surrounding structures were obtained without intravenous contrast. COMPARISON:  Head CT 08/24/2018 FINDINGS: Brain: Mildly motion degraded examination. There is a moderate-sized region of restricted diffusion within the right MCA territory centered within the right frontal operculum and extending to the right insula, consistent with known acute ischemic infarction. The infarct is similar in extent as compared to head CT 08/24/2018. There is corresponding parenchymal T2/FLAIR hyperintensity and mild regional mass effect. No midline shift. No evidence of intracranial hemorrhage. No extra-axial collection. Moderate generalized parenchymal atrophy. Severe scattered and confluent T2/FLAIR hyperintensity within the cerebral white matter is nonspecific, but consistent with chronic small vessel ischemic disease. Vascular: No definite loss of flow voids within the proximal large vessels. Skull and upper cervical spine: Normal marrow signal. Sinuses/Orbits: Bilateral lens replacements.The imaged globes and orbits are otherwise unremarkable. Mild ethmoid and maxillary sinus mucosal thickening. Trace left mastoid effusion. IMPRESSION: Moderate-sized acute ischemic infarction within the right MCA territory, centered within the right frontal  operculum and extending to the right insula. The infarct is similar in extent as compared to head CT 08/24/2018. Mild regional mass effect. No midline shift or evidence of intracranial hemorrhage. Moderate generalized parenchymal atrophy. Severe chronic small vessel ischemic disease. Electronically Signed   By: Kellie Simmering   On: 08/25/2018 09:16   US Carotid Bilateral (at [facility] Only)  Result Date: 08/25/2018 CLINICAL DATA:  86 year old female with a history of stroke EXAM: BILATERAL CAROTID DUPLEX ULTRASOUND TECHNIQUE: Pearline Cables scale imaging, color Doppler and duplex ultrasound were performed of bilateral carotid and vertebral arteries in the neck. COMPARISON:  None. FINDINGS: Criteria: Quantification of carotid stenosis is based on velocity parameters that correlate the residual internal carotid diameter with NASCET-based stenosis levels, using the diameter of the distal internal carotid lumen as the denominator for stenosis measurement. The following velocity measurements were obtained: RIGHT ICA:  Systolic 52 cm/sec, Diastolic 11 cm/sec CCA:  69 cm/sec SYSTOLIC ICA/CCA RATIO:  0.8 ECA:  104 cm/sec LEFT ICA:  Systolic 58 cm/sec, Diastolic 16 cm/sec CCA:  71 cm/sec SYSTOLIC ICA/CCA RATIO:  0.8 ECA:  131 cm/sec Right Brachial SBP: Not acquired Left Brachial SBP: Not acquired RIGHT CAROTID ARTERY: No significant calcifications of the right common carotid artery. Intermediate waveform maintained. Heterogeneous and partially calcified plaque at the right carotid bifurcation. No significant lumen shadowing. Low resistance waveform of the right ICA. No significant tortuosity. RIGHT VERTEBRAL ARTERY: Antegrade flow with low resistance waveform. LEFT CAROTID ARTERY: No significant  calcifications of the left common carotid artery. Intermediate waveform maintained. Heterogeneous and partially calcified plaque at the left carotid bifurcation without significant lumen shadowing. Low resistance waveform of the left ICA.  No significant tortuosity. LEFT VERTEBRAL ARTERY:  Antegrade flow with low resistance waveform. IMPRESSION: Color duplex indicates minimal heterogeneous and calcified plaque, with no hemodynamically significant stenosis by duplex criteria in the extracranial cerebrovascular circulation. Signed, Dulcy Fanny. Dellia Nims, RPVI [facility] Electronically Signed   By: Corrie Mckusick D.O.   On: 08/25/2018 10:21     Assessment/Plan:  85 y.o. female with essential thrombocytosis, hyperlipidemia, hypertension, hypothyroidism and history of breast cancer presents to the emergency department due to facial drooping.  The patient's daughter noted that her mom was having difficulty drinking day prior.  She denies weakness, numbness or tingling in her extremities.  However, her daughter feels as if her mom speech is somewhat changed.  CT of her head shows right frontal ischemic stroke.   MRI with R superior division stroke in the R MCA territory.    - Con't ASA/Statin - periods of delirium - d/w daughter to turn on tv during the day and keep blinds open due to abnormal sleep/wake cycle.  - will likely need rehab placement  08/26/2018, 10:07 AM

## 2018-08-26 NOTE — TOC Progression Note (Signed)
Transition of Care [facility]) - Progression Note    Patient Details  Name: Rachel Durham MRN: [id number] Date of Birth: [birth year]  Transition of Care [facility]) CM/SW Contact  Shelbie Hutching, RN Phone Number: 08/26/2018, 12:22 PM  Clinical Narrative:     Patient's daughters have expressed that they want to do the short term rehab and then consider assisted living.  List of [facility] emailed to [place].  Bed offers presented to both daughters waiting on them to choose.  Family had expressed that they wanted patient to go to [facility] but [facility] does not have any bed availability this week.  Will follow up with daughters later today.   Patient is withdrawn and not happy about going to rehab.  RNCM will cont to follow and help with discharge transition.   Expected Discharge Plan: [place] Barriers to Discharge: Continued Medical Work up  Expected Discharge Plan and Services Expected Discharge Plan: [place]   Discharge Planning Services: CM Consult Post Acute Care Choice: [place] arrangements for the past 2 months: Single Family Home Expected Discharge Date: 08/26/18                                     Social Determinants of Health (SDOH) Interventions    Readmission Risk Interventions No flowsheet data found.

## 2018-08-26 NOTE — Evaluation (Signed)
Speech Language Pathology Evaluation Patient Details Name: Rachel Durham MRN: [id number] DOB: 1920/02/08 Today's Date: 08/26/2018 Time: 1005-1100 SLP Time Calculation (min) (ACUTE ONLY): 55 min  Problem List:  Patient Active Problem List   Diagnosis Date Noted  . Stroke (cerebrum) ([place]) 08/24/2018  . Essential thrombocytosis ([place]) 01/04/2016   Past Medical History:  Past Medical History:  Diagnosis Date  . Breast cancer ([place])   . Essential thrombocytosis ([place])   . Hyperlipidemia   . Hypertension   . Hypothyroidism    Past Surgical History:  Past Surgical History:  Procedure Laterality Date  . ABDOMINAL HYSTERECTOMY    . MASTECTOMY     HPI:  Pt is a 85 y.o. female w/ PMH of breast cancer, HOH, HTN, hypothyroidism, hyperlipidemia who presents with left facial droop and slurred speech.  Daughter is providing history and reports the patient was last seen well at 9 PM last night.  Slept later than usual today and when she got out of bed husband noted that she had a facial droop and was drooling while trying to eat her cereal.  Daughter notes her voice has been slurred as well.  In the ED, Normal speech and language; Mood and affect are normal. Behavior normal. No gross focal neurologic deficits per ED MD. MRI with R superior division stroke in the R MCA territory.    Assessment / Plan / Recommendation Clinical Impression  Pt appears to present w/ grossly adequate Receptive and Expressive Language abilities(No Aphasia), however, her verbal output is Moderately reduced and she does not readily engage w/ others as much today - unsure if impact from being out of her home/element and being hospitalized at her advanced, elderly age. Noted MRI with R superior division stroke in the R MCA territory. Pt does exhibit a slight-min Left labial decreased tone/ROM/asymmetry - this does NOT impact her speech articulation and intelligibility. All verbal responses she gave were 100% intelligible. NO  motor speech deficits noted at this evaluation. Again, pt appeared more passive and less engaged, less verbal today. Unsure if Hearing deficits may be impacting her engagement, Cogntive follow along in conversation. Pt was able to read single words and short phrase written for her, identify ADL objects w/ accuracy. Pt lives with her spouse of almost 58 years in a home with level entry. Pt A/O to self/place but unaware of her situation overall. During the OT session, pt exhibited deficits w/ LUE strength and coordination, sequencing, safety awareness, and requiring increased cues to assist alongside physical assist to perform functional ADLs. Pt is eager to return to PLOF with her spouse. During swallowing tasks in room w/ her, pt consumed sips of thin liquids via cup and straw w/ NO overt s/s of aspiration noted; she declined any foods offered from breakfast(banana) and other(pudding, jello, applesauce).  Unsure if pt is having any deficits from the new ischemic stroke other than the slight-mild Left labial decreased tone(this is NOT causing any Dysarthria), however, it is difficult to assess any frontal lobe deficits d/t pt's limited engagement at this assessment. Recommend f/u at next venue of care for further assessment of needs w/ ADLs in hopes to return more independently, and safely, to prior living situation.     SLP Assessment  SLP Recommendation/Assessment: All further Speech Lanaguage Pathology  needs can be addressed in the next venue of care SLP Visit Diagnosis: Frontal lobe and executive function deficit Frontal lobe and executive function deficit following: Cerebral infarction    Follow Up Recommendations  Skilled Nursing facility    Frequency and Duration (TBD)  (TBD)      SLP Evaluation Cognition  Overall Cognitive Status: Impaired/Different from baseline(per report) Arousal/Alertness: Awake/alert Orientation Level: Oriented to person;Oriented to place Attention:  Focused;Sustained Focused Attention: Appears intact Sustained Attention: Appears intact Awareness: (decreased per report) Executive Function: Reasoning;Sequencing;Organizing(were declined w/ OT session) Behaviors: (Passive w/ reduced engagement) Safety/Judgment: Impaired(per OT report)       Comprehension  Auditory Comprehension Overall Auditory Comprehension: Appears within functional limits for tasks assessed Yes/No Questions: Within Functional Limits(w/ few) Commands: Within Functional Limits(w/ few) Conversation: Simple Other Conversation Comments: few verbal responses to basic questions (2-4 word responses) Interfering Components: Hearing(may be impacting) EffectiveTechniques: Increased volume;Pausing;Repetition;Stressing words;Visual/Gestural cues Visual Recognition/Discrimination Discrimination: Not tested Reading Comprehension Reading Status: Within funtional limits(single words)    Expression Expression Primary Mode of Expression: Verbal Verbal Expression Overall Verbal Expression: Appears within functional limits for tasks assessed(grossly [facility] though minimal verbal output to assess fully) Automatic Speech: Name;Social Response;Counting(WFL) Level of Generative/Spontaneous Verbalization: Word;Phrase Repetition: No impairment Naming: No impairment Non-Verbal Means of Communication: Not applicable Written Expression Dominant Hand: Right Written Expression: Not tested   Oral / Motor  Oral Motor/Sensory Function Overall Oral Motor/Sensory Function: Mild impairment(-Slight impairment) Facial ROM: Reduced left Facial Symmetry: Abnormal symmetry left Facial Strength: Reduced left Facial Sensation: Reduced left Lingual ROM: Within Functional Limits Lingual Symmetry: Within Functional Limits Lingual Strength: Within Functional Limits Mandible: Within Functional Limits Motor Speech Overall Motor Speech: Appears within functional limits for tasks assessed Respiration:  Within functional limits Phonation: Normal Resonance: Within functional limits Articulation: Within functional limitis Intelligibility: Intelligible Motor Planning: Witnin functional limits Motor Speech Errors: Not applicable   GO                      Orinda Kenner, MS, CCC-SLP Temika Sutphin 08/26/2018, 4:54 PM

## 2018-08-26 NOTE — Progress Notes (Signed)
PT Cancellation Note  Patient Details Name: Rachel Durham MRN: [id number] DOB: 08/19/1920   Cancelled Treatment:    Reason Eval/Treat Not Completed: Other (comment) Order received. Chart reviewed. Nurse communicated. Per nursing notes this morning at 6:58 am "pt was up numerous times last night and was becoming agitated and more confused" PT attempted the session at 9:34 am today and pt still sleeping soundly. Per communicate with nurse and reports sleeping will likely improve pt's symptoms including agitation and confusion. Will attempt PT session later today to allow pt's symptoms recover.    Sherrilyn Rist, SPT 08/26/2018, 9:35 AM

## 2018-08-26 NOTE — Progress Notes (Signed)
Pt d/c to [facility] via EMS. IV removed intact. VSS. Report given to RN [facility] [employer]. All belongings sent with pt.

## 2018-08-26 NOTE — Progress Notes (Signed)
OT Cancellation Note  Patient Details Name: Rachel Durham MRN: [id number] DOB: July 14, 1920   Cancelled Treatment:    Reason Eval/Treat Not Completed: Fatigue/lethargy limiting ability to participate. Upon attempt, pt sleeping soundly. Wakes briefly to therapist's voice and tactile cues but keeps eyes closed throughout. Pt declining therapy 2/2 fatigue. Will re-attempt at later date/time as pt is able to participate and medically appropriate.   Jeni Salles, MPH, MS, OTR/L ascom [phone] 08/26/18, 2:02 PM

## 2018-08-29 LAB — URINE CULTURE: Culture: >=100000 — AB

## 2018-09-04 ENCOUNTER — Telehealth: Payer: Self-pay | Admitting: *Deleted

## 2018-09-04 NOTE — Telephone Encounter (Signed)
Patients daughter Dory Larsen) called to cancel 09/08/18 lab/MD appts and stated that she will call back to R/S.

## 2018-09-08 ENCOUNTER — Inpatient Hospital Stay: Payer: Medicare Other | Admitting: Oncology

## 2018-09-08 ENCOUNTER — Inpatient Hospital Stay: Payer: Medicare Other

## 2018-09-30 DEATH — deceased
